# Patient Record
Sex: Female | Born: 1965 | Race: White | Hispanic: No | Marital: Married | Smoking: Never smoker
Health system: Southern US, Community
[De-identification: ages and names within clinical notes are randomized; demographics above are authoritative.]

## PROBLEM LIST (undated history)

## (undated) DIAGNOSIS — F988 Other specified behavioral and emotional disorders with onset usually occurring in childhood and adolescence: Secondary | ICD-10-CM

## (undated) DIAGNOSIS — Z87442 Personal history of urinary calculi: Secondary | ICD-10-CM

## (undated) DIAGNOSIS — J45909 Unspecified asthma, uncomplicated: Secondary | ICD-10-CM

## (undated) DIAGNOSIS — E039 Hypothyroidism, unspecified: Secondary | ICD-10-CM

## (undated) DIAGNOSIS — Z86018 Personal history of other benign neoplasm: Secondary | ICD-10-CM

## (undated) DIAGNOSIS — F329 Major depressive disorder, single episode, unspecified: Secondary | ICD-10-CM

## (undated) DIAGNOSIS — D649 Anemia, unspecified: Secondary | ICD-10-CM

## (undated) DIAGNOSIS — M25471 Effusion, right ankle: Secondary | ICD-10-CM

## (undated) DIAGNOSIS — Z9889 Other specified postprocedural states: Secondary | ICD-10-CM

## (undated) DIAGNOSIS — G4700 Insomnia, unspecified: Secondary | ICD-10-CM

## (undated) DIAGNOSIS — G43909 Migraine, unspecified, not intractable, without status migrainosus: Secondary | ICD-10-CM

## (undated) DIAGNOSIS — R112 Nausea with vomiting, unspecified: Secondary | ICD-10-CM

## (undated) DIAGNOSIS — G4730 Sleep apnea, unspecified: Secondary | ICD-10-CM

## (undated) DIAGNOSIS — I10 Essential (primary) hypertension: Secondary | ICD-10-CM

## (undated) DIAGNOSIS — J189 Pneumonia, unspecified organism: Secondary | ICD-10-CM

## (undated) DIAGNOSIS — K219 Gastro-esophageal reflux disease without esophagitis: Secondary | ICD-10-CM

## (undated) DIAGNOSIS — M25475 Effusion, left foot: Secondary | ICD-10-CM

## (undated) DIAGNOSIS — J302 Other seasonal allergic rhinitis: Secondary | ICD-10-CM

## (undated) DIAGNOSIS — M25474 Effusion, right foot: Secondary | ICD-10-CM

## (undated) DIAGNOSIS — F32A Depression, unspecified: Secondary | ICD-10-CM

## (undated) DIAGNOSIS — N3281 Overactive bladder: Secondary | ICD-10-CM

## (undated) HISTORY — PX: TUBAL LIGATION: SHX77

## (undated) HISTORY — PX: GASTRIC BYPASS: SHX52

## (undated) HISTORY — PX: AUGMENTATION MAMMAPLASTY: SUR837

## (undated) HISTORY — PX: ABDOMINOPLASTY: SUR9

## (undated) HISTORY — PX: CHOLECYSTECTOMY: SHX55

## (undated) HISTORY — PX: ABDOMINAL SURGERY: SHX537

## (undated) HISTORY — PX: URETHRAL SLING: SHX2621

## (undated) HISTORY — PX: ABDOMINAL HYSTERECTOMY: SHX81

## (undated) HISTORY — PX: REFRACTIVE SURGERY: SHX103

---
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
## 2017-07-24 DIAGNOSIS — Z8659 Personal history of other mental and behavioral disorders: Secondary | ICD-10-CM | POA: Insufficient documentation

## 2017-07-24 DIAGNOSIS — E7849 Other hyperlipidemia: Secondary | ICD-10-CM | POA: Insufficient documentation

## 2017-07-24 DIAGNOSIS — F5101 Primary insomnia: Secondary | ICD-10-CM | POA: Insufficient documentation

## 2017-07-24 DIAGNOSIS — E038 Other specified hypothyroidism: Secondary | ICD-10-CM | POA: Insufficient documentation

## 2018-02-18 DIAGNOSIS — F988 Other specified behavioral and emotional disorders with onset usually occurring in childhood and adolescence: Secondary | ICD-10-CM | POA: Insufficient documentation

## 2018-06-10 ENCOUNTER — Emergency Department: Payer: BLUE CROSS/BLUE SHIELD

## 2018-06-10 ENCOUNTER — Emergency Department
Admission: EM | Admit: 2018-06-10 | Discharge: 2018-06-10 | Disposition: A | Discharge status: Home / Self Care | Payer: BLUE CROSS/BLUE SHIELD | Attending: Emergency Medicine | Admitting: Emergency Medicine

## 2018-06-10 ENCOUNTER — Other Ambulatory Visit: Payer: Self-pay

## 2018-06-10 ENCOUNTER — Encounter: Payer: Self-pay | Admitting: Emergency Medicine

## 2018-06-10 DIAGNOSIS — R1010 Upper abdominal pain, unspecified: Secondary | ICD-10-CM | POA: Diagnosis present

## 2018-06-10 DIAGNOSIS — Z79899 Other long term (current) drug therapy: Secondary | ICD-10-CM | POA: Insufficient documentation

## 2018-06-10 DIAGNOSIS — Z9884 Bariatric surgery status: Secondary | ICD-10-CM | POA: Diagnosis not present

## 2018-06-10 DIAGNOSIS — K529 Noninfective gastroenteritis and colitis, unspecified: Secondary | ICD-10-CM | POA: Insufficient documentation

## 2018-06-10 LAB — COMPREHENSIVE METABOLIC PANEL
ALBUMIN: 4.2 g/dL (ref 3.5–5.0)
ALK PHOS: 81 U/L (ref 38–126)
ALT: 13 U/L (ref 0–44)
ANION GAP: 9 (ref 5–15)
AST: 16 U/L (ref 15–41)
BILIRUBIN TOTAL: 0.5 mg/dL (ref 0.3–1.2)
BUN: 13 mg/dL (ref 6–20)
CALCIUM: 8.9 mg/dL (ref 8.9–10.3)
CO2: 30 mmol/L (ref 22–32)
CREATININE: 0.75 mg/dL (ref 0.44–1.00)
Chloride: 100 mmol/L (ref 98–111)
GFR calc Af Amer: >60 mL/min (ref >60)
GFR calc non Af Amer: >60 mL/min (ref >60)
GLUCOSE: 94 mg/dL (ref 70–99)
Potassium: 3.3 mmol/L — ABNORMAL LOW (ref 3.5–5.1)
SODIUM: 139 mmol/L (ref 135–145)
TOTAL PROTEIN: 7.7 g/dL (ref 6.5–8.1)

## 2018-06-10 LAB — POCT PREGNANCY, URINE: PREG TEST UR: NEGATIVE

## 2018-06-10 LAB — CBC
HCT: 39 % (ref 36.0–46.0)
Hemoglobin: 13.1 g/dL (ref 12.0–15.0)
MCH: 30.5 pg (ref 26.0–34.0)
MCHC: 33.6 g/dL (ref 30.0–36.0)
MCV: 90.9 fL (ref 80.0–100.0)
PLATELETS: 265 10*3/uL (ref 150–400)
RBC: 4.29 MIL/uL (ref 3.87–5.11)
RDW: 13.2 % (ref 11.5–15.5)
WBC: 9.9 10*3/uL (ref 4.0–10.5)
nRBC: 0 % (ref 0.0–0.2)

## 2018-06-10 LAB — URINALYSIS, COMPLETE (UACMP) WITH MICROSCOPIC
Bilirubin Urine: NEGATIVE
GLUCOSE, UA: NEGATIVE mg/dL
Hgb urine dipstick: NEGATIVE
Ketones, ur: 5 mg/dL — AB
Nitrite: NEGATIVE
PH: 5 (ref 5.0–8.0)
PROTEIN: NEGATIVE mg/dL
Specific Gravity, Urine: 1.024 (ref 1.005–1.030)

## 2018-06-10 LAB — LIPASE, BLOOD: Lipase: 36 U/L (ref 11–51)

## 2018-06-10 MED ORDER — IOPAMIDOL (ISOVUE-300) INJECTION 61%
100.0000 mL | Freq: Once | INTRAVENOUS | Status: AC | PRN
  Administered 2018-06-10: 100 mL via INTRAVENOUS

## 2018-06-10 MED ORDER — ONDANSETRON HCL 4 MG/2ML IJ SOLN
4.0000 mg | Freq: Once | INTRAMUSCULAR | Status: AC
  Administered 2018-06-10: 4 mg via INTRAVENOUS
  Filled 2018-06-10: qty 2

## 2018-06-10 MED ORDER — ALUM & MAG HYDROXIDE-SIMETH 200-200-20 MG/5ML PO SUSP
30.0000 mL | Freq: Once | ORAL | Status: AC
  Administered 2018-06-10: 30 mL via ORAL
  Filled 2018-06-10: qty 30

## 2018-06-10 MED ORDER — FAMOTIDINE IN NACL 20-0.9 MG/50ML-% IV SOLN
20.0000 mg | Freq: Once | INTRAVENOUS | Status: AC
  Administered 2018-06-10: 20 mg via INTRAVENOUS
  Filled 2018-06-10: qty 50

## 2018-06-10 MED ORDER — FAMOTIDINE 20 MG PO TABS: 20.0000 mg | ORAL_TABLET | Freq: Two times a day (BID) | ORAL | 0 refills | Status: DC

## 2018-06-10 MED ORDER — SODIUM CHLORIDE 0.9 % IV BOLUS
1000.0000 mL | Freq: Once | INTRAVENOUS | Status: AC
  Administered 2018-06-10: 1000 mL via INTRAVENOUS

## 2018-06-10 MED ORDER — ALUMINUM-MAGNESIUM-SIMETHICONE 200-200-20 MG/5ML PO SUSP: 30.0000 mL | Freq: Three times a day (TID) | ORAL | 0 refills | Status: DC

## 2018-06-10 MED ORDER — ONDANSETRON 4 MG PO TBDP: 4.0000 mg | ORAL_TABLET | Freq: Three times a day (TID) | ORAL | 0 refills | Status: DC | PRN

## 2018-06-10 MED ORDER — OXYCODONE HCL 5 MG PO TABS
5.0000 mg | ORAL_TABLET | ORAL | Status: AC
  Administered 2018-06-10: 5 mg via ORAL
  Filled 2018-06-10: qty 1

## 2018-06-10 MED ORDER — OXYCODONE HCL 5 MG PO TABS: 5.0000 mg | ORAL_TABLET | Freq: Four times a day (QID) | ORAL | 0 refills | Status: DC | PRN

## 2018-06-10 MED ORDER — MORPHINE SULFATE (PF) 4 MG/ML IV SOLN
4.0000 mg | Freq: Once | INTRAVENOUS | Status: AC
  Administered 2018-06-10: 4 mg via INTRAVENOUS
  Filled 2018-06-10: qty 1

## 2018-06-10 NOTE — ED Provider Notes (Addendum)
[facility] Emergency Department Provider Note  ____________________________________________  Time seen: Approximately 11:18 AM  I have reviewed the triage vital signs and the nursing notes.   HISTORY  Chief Complaint Abdominal Pain    HPI Catherine Cervantes is a 52 y.o. female with a history of gastric bypass surgery, recently treated for sinusitis with decongestants and prednisone reports that for the past 2 nights she is been having upper abdominal pain and mid back pain.  Hurts to move, hurts to breathe.  No alleviating factors.  Denies chest pain or shortness of breath to me.  She also has a nonproductive cough.    No recent travel trauma hospitalization or surgery.  Normal oral intake although today she is having nausea and has not eaten.      History reviewed. No pertinent past medical history.   There are no active problems to display for this patient.    Past Surgical History:  Procedure Laterality Date  . ABDOMINAL SURGERY    . CHOLECYSTECTOMY    Bariatric surgery   Prior to Admission medications   Medication Sig Start Date End Date Taking? Authorizing Provider  aluminum-magnesium hydroxide-simethicone (MAALOX) 200-200-20 MG/5ML SUSP Take 30 mLs by mouth 4 (four) times daily -  before meals and at bedtime. 06/10/18   Sharman Cheek, MD  famotidine (PEPCID) 20 MG tablet Take 1 tablet (20 mg total) by mouth 2 (two) times daily. 06/10/18   Sharman Cheek, MD  ondansetron (ZOFRAN ODT) 4 MG disintegrating tablet Take 1 tablet (4 mg total) by mouth every 8 (eight) hours as needed for nausea or vomiting. 06/10/18   Sharman Cheek, MD  oxyCODONE (ROXICODONE) 5 MG immediate release tablet Take 1 tablet (5 mg total) by mouth every 6 (six) hours as needed for breakthrough pain. 06/10/18   Sharman Cheek, MD  Medications Reconcile with Patient's Chart  Medication Sig Dispensed Refills Start Date End Date Status  SUMAtriptan (IMITREX) 100 MG  tablet  TAKE 1 TABLET BY MOUTH ONCE AS NEEDED FOR MIGRAINE MAY TAKE A SECOND DOSE AFTER 2 HOURS IF NEEDED.  11 07/24/2017  Active  DM/pseudoephed/acetaminoph/cpm (THERAFLU COLD-COUGH ORAL)  Take by mouth as needed  0   Active  DM/PE/acetaminophen/doxylamine (VICKS DAYQUIL-NYQUIL ORAL)  Take by mouth once daily  0   Active  eucalyptus oil/menthol/camphor (VICKS VAPORUB TOP)  Apply topically as needed  0   Active  albuterol 90 mcg/actuation inhaler  Indications: Bronchitis Inhale 2 inhalations into the lungs every 6 (six) hours as needed for Wheezing 1 Inhaler  12 10/22/2017 10/22/2018 Active  RITALIN 10 mg tablet  Take 1 tablet (10 mg total) by mouth 2 (two) times daily 60 tablet  0 02/18/2018  Active  zolpidem (AMBIEN) 10 mg tablet  Take 1 tablet (10 mg total) by mouth nightly as needed for Sleep 30 tablet  3 02/18/2018  Active  estradiol (ESTRACE) 2 MG tablet  Take 1 tablet (2 mg total) by mouth once daily 30 tablet  2 03/18/2018  Active  levothyroxine (SYNTHROID, LEVOTHROID) 200 MCG tablet  Take 1 tablet (200 mcg total) by mouth once daily Take on an empty stomach with a glass of water at least 30-60 minutes before breakfast. 30 tablet  11 05/30/2018  Active  amoxicillin-clavulanate (AUGMENTIN) 875-125 mg tablet  Indications: Bacterial sinusitis, unspecified Take 1 tablet (875 mg total) by mouth 2 (two) times daily 20 tablet  0 06/05/2018 06/15/2018 Active  predniSONE (DELTASONE) 10 MG tablet  Indications: Bacterial sinusitis, unspecified 6-day taper; 6  pills day one, 5 pills day 2, 4 pills day 3, etc. Follow package instructions. 21 tablet  0 06/05/2018  Active  fluticasone propionate (FLONASE) 50 mcg/actuation nasal spray  Indications: Bacterial sinusitis, unspecified Place 2 sprays into both nostrils once daily           Allergies Patient has no known allergies.   No family history on file.  Social History Social History   Tobacco Use  . Smoking  status: Never Smoker  . Smokeless tobacco: Never Used  Substance Use Topics  . Alcohol use: Yes    Comment: occas  . Drug use: Not on file    Review of Systems  Constitutional:   No fever or chills.  ENT:   No sore throat. No rhinorrhea. Cardiovascular:   No chest pain or syncope. Respiratory:   No dyspnea positive nonproductive cough. Gastrointestinal:   Positive for upper abdominal pain.  No vomiting or diarrhea.  Musculoskeletal:   Negative for focal pain or swelling All other systems reviewed and are negative except as documented above in ROS and HPI.  ____________________________________________   PHYSICAL EXAM:  VITAL SIGNS: ED Triage Vitals  Enc Vitals Group     BP 06/10/18 0815 (!) 164/114     Pulse Rate 06/10/18 0815 66     Resp 06/10/18 0815 18     Temp 06/10/18 0816 97.7 F (36.5 C)     Temp Source 06/10/18 0815 Oral     SpO2 06/10/18 0815 100 %     Weight 06/10/18 0815 150 lb (68 kg)     Height 06/10/18 0815 5\' 1"  (1.549 m)     Head Circumference --      Peak Flow --      Pain Score 06/10/18 0815 7     Pain Loc --      Pain Edu? --      Excl. in GC? --     Vital signs reviewed, nursing assessments reviewed.   Constitutional:   Alert and oriented. Non-toxic appearance. Eyes:   Conjunctivae are normal. EOMI. PERRL. ENT      Head:   Normocephalic and atraumatic.      Nose:   No congestion/rhinnorhea.       Mouth/Throat:   MMM, no pharyngeal erythema. No peritonsillar mass.       Neck:   No meningismus. Full ROM. Hematological/Lymphatic/Immunilogical:   No cervical lymphadenopathy. Cardiovascular:   RRR. Symmetric bilateral radial and DP pulses.  No murmurs. Cap refill less than 2 seconds. Respiratory:   Normal respiratory effort without tachypnea/retractions. Breath sounds are clear and equal bilaterally. No wheezes/rales/rhonchi. Gastrointestinal:   Soft with left upper quadrant and epigastric tenderness. Non distended. There is no CVA tenderness.  No  rebound, rigidity, or guarding. Musculoskeletal:   Normal range of motion in all extremities. No joint effusions.  No lower extremity tenderness.  No edema. Neurologic:   Normal speech and language.  Motor grossly intact. No acute focal neurologic deficits are appreciated.  Skin:    Skin is warm, dry and intact. No rash noted.  No petechiae, purpura, or bullae.  ____________________________________________    LABS (pertinent positives/negatives) (all labs ordered are listed, but only abnormal results are displayed) Labs Reviewed  COMPREHENSIVE METABOLIC PANEL - Abnormal; Notable for the following components:      Result Value   Potassium 3.3 (*)    All other components within normal limits  URINALYSIS, COMPLETE (UACMP) WITH MICROSCOPIC - Abnormal; Notable for the following components:  Color, Urine YELLOW (*)    APPearance HAZY (*)    Ketones, ur 5 (*)    Leukocytes, UA TRACE (*)    Bacteria, UA RARE (*)    All other components within normal limits  LIPASE, BLOOD  CBC  POC URINE PREG, ED  POCT PREGNANCY, URINE   ____________________________________________   EKG    ____________________________________________    RADIOLOGY  Ct Abdomen Pelvis W Contrast  Result Date: 06/10/2018 CLINICAL DATA:  Upper abdominal pain and nausea EXAM: CT ABDOMEN AND PELVIS WITH CONTRAST TECHNIQUE: Multidetector CT imaging of the abdomen and pelvis was performed using the standard protocol following bolus administration of intravenous contrast. CONTRAST:  ISOVUE-300 IOPAMIDOL (ISOVUE-300) INJECTION 61% COMPARISON:  None. FINDINGS: Lower chest: Lung bases are clear. Hepatobiliary: Calcification is noted along the periphery of the right lobe of the liver, most likely secondary to granulomatous disease. Residua of old trauma is also possible in this area. No other focal liver lesions are appreciable. There is slight fatty infiltration in the region of the fissure for the ligamentum teres.  Gallbladder is absent. There is no evident biliary duct dilatation. Pancreas: There is no appreciable pancreatic mass or inflammatory focus. Spleen: No splenic lesions are evident. Adrenals/Urinary Tract: Adrenals bilaterally appear unremarkable. Kidneys bilaterally show no evident mass or hydronephrosis on either side. There is no appreciable renal or ureteral calculus on either side. Urinary bladder is midline with wall thickness within normal limits. Stomach/Bowel: Changes from previous gastric bypass procedure noted. No wall thickening or fluid in the postoperative regions. Elsewhere, there are several loops of jejunum in the left abdomen with mildly thickened walls. The surrounding mesentery is not thickened. There is no evident bowel obstruction. No free air or portal venous air. Vascular/Lymphatic: There is atherosclerotic calcification in portions of the aorta. No aneurysm. Major mesenteric arterial vessels appear patent. There is no evident adenopathy in the abdomen or pelvis. Reproductive: Uterus is absent.  There is no evident pelvic mass. Other: The appendix appears normal. There is no abscess or ascites in the abdomen or pelvis. Musculoskeletal: There is an apparent small bone island in the intertrochanteric region of the right proximal femur. There are no lytic or destructive bone lesions. There is no intramuscular or abdominal wall lesion. IMPRESSION: 1. Mild wall thickening involving several loops of proximal jejunum on the left. Suspect a degree of enteritis. No evident bowel obstruction. 2. Status post gastric bypass procedure without complicating features evident. 3.  Appendix appears normal.  No abscess in the abdomen or pelvis. 4.  No renal or ureteral calculus.  No hydronephrosis. 5. Calcification in the right lobe of the liver. Question residua of prior granulomas versus residual from previous trauma. No associated edema or mass effect. No abnormal enhancement in this area. 6.  Gallbladder  absent.  Uterus absent. 7.  Foci of aortic atherosclerosis. Aortic Atherosclerosis (ICD10-I70.0). Electronically Signed   By: Bretta Bang III M.D.   On: 06/10/2018 10:09    ____________________________________________   PROCEDURES Procedures  ____________________________________________  DIFFERENTIAL DIAGNOSIS   Bowel obstruction, perforation, gastritis, enteritis, pancreatitis.  Doubt biliary disease or appendicitis.  Doubt UTI or pyelonephritis.  Doubt STI PID TOA or torsion  CLINICAL IMPRESSION / ASSESSMENT AND PLAN / ED COURSE  Pertinent labs & imaging results that were available during my care of the patient were reviewed by me and considered in my medical decision making (see chart for details).    Patient presents with upper abdominal pain, worsening for the past 2  days.  With her recent prednisone use and history of bariatric surgery most likely this is gastritis.  Check labs, obtain CT scan due to complicated medical history and abdominal surgeries.  IV fluids, morphine 4 mg IV for pain control.   ----------------------------------------- 1:16 PM on 06/10/2018 -----------------------------------------  CT scan unremarkable, shows some mild jejunitis.  May be due to degree of GERD as well.  No severe complicating features.  Labs are normal.  Discussed with patient, she prefers to go home where she will continue an acids and follow-up with her doctor.  Advised her to stop the prednisone immediately that she is been taking for her sinusitis.  Opioid precautions discussed.     ____________________________________________   FINAL CLINICAL IMPRESSION(S) / ED DIAGNOSES    Final diagnoses:  Pain of upper abdomen  Jejunitis     ED Discharge Orders         Ordered    aluminum-magnesium hydroxide-simethicone (MAALOX) 200-200-20 MG/5ML SUSP  3 times daily before meals & bedtime     06/10/18 1315    famotidine (PEPCID) 20 MG tablet  2 times daily     06/10/18 1315      ondansetron (ZOFRAN ODT) 4 MG disintegrating tablet  Every 8 hours PRN     06/10/18 1315    oxyCODONE (ROXICODONE) 5 MG immediate release tablet  Every 6 hours PRN     06/10/18 1315          Portions of this note were generated with dragon dictation software. Dictation errors may occur despite best attempts at proofreading.    Sharman Cheek, MD 06/10/18 1316    Sharman Cheek, MD 06/10/18 [phone]

## 2018-06-10 NOTE — ED Notes (Signed)
Pt c/o having pain across mid back the last 2 nights, this morning began having LUQ pain with nausea. Denies any vomiting or diarrhea. Denies hx of kidney stones or having any painful urination.

## 2018-06-10 NOTE — ED Triage Notes (Signed)
Pt states mid to upper left quadrant pain since 0530 this am with nausea. Also lower back pain not more on one side than the other. Denies hx of kidney stones, denies burning/pain with urination. Does not have her gall bladder. Gastric bypass 4 years ago. Currently being treated for sinus infection. Appears in pain, grabbing her left side and leaning over in chair.

## 2018-06-10 NOTE — Discharge Instructions (Signed)
Your CT scan shows inflammation in your small intestine just outside the stomach.  Take nausea medicine and antacids, be sure to stay well-hydrated, and follow-up with your doctor later this week.  Your symptoms should improve day by day.

## 2019-02-02 ENCOUNTER — Other Ambulatory Visit: Payer: Self-pay | Admitting: Internal Medicine

## 2019-02-02 DIAGNOSIS — Z1231 Encounter for screening mammogram for malignant neoplasm of breast: Secondary | ICD-10-CM

## 2019-02-09 ENCOUNTER — Ambulatory Visit
Admission: RE | Admit: 2019-02-09 | Discharge: 2019-02-09 | Disposition: A | Discharge status: Home / Self Care | Payer: BLUE CROSS/BLUE SHIELD | Source: Ambulatory Visit | Attending: Internal Medicine | Admitting: Internal Medicine

## 2019-02-09 ENCOUNTER — Other Ambulatory Visit: Payer: Self-pay

## 2019-02-09 DIAGNOSIS — Z1231 Encounter for screening mammogram for malignant neoplasm of breast: Secondary | ICD-10-CM | POA: Diagnosis present

## 2019-09-21 DIAGNOSIS — R519 Headache, unspecified: Secondary | ICD-10-CM | POA: Insufficient documentation

## 2019-10-30 ENCOUNTER — Ambulatory Visit: Payer: BLUE CROSS/BLUE SHIELD | Attending: Internal Medicine

## 2019-10-30 DIAGNOSIS — Z23 Encounter for immunization: Secondary | ICD-10-CM

## 2019-10-30 NOTE — Progress Notes (Signed)
   Covid-19 Vaccination Clinic  Name:  Catherine Cervantes    MRN: [id number] DOB: 1965/12/23  10/30/2019  [name] was observed post Covid-19 immunization for 15 minutes without incident. She was provided with Vaccine Information Sheet and instruction to access the V-Safe system.   [name] was instructed to call 911 with any severe reactions post vaccine: Marland Kitchen Difficulty breathing  . Swelling of face and throat  . A fast heartbeat  . A bad rash all over body  . Dizziness and weakness   Immunizations Administered    Name Date Dose VIS Date Route   Moderna COVID-19 Vaccine 10/30/2019 12:52 PM 0.5 mL 07/21/2019 Intramuscular   Manufacturer: Moderna   Lot: [id number]   NDC: 85027-741-28

## 2019-11-02 DIAGNOSIS — G479 Sleep disorder, unspecified: Secondary | ICD-10-CM | POA: Insufficient documentation

## 2019-12-01 ENCOUNTER — Ambulatory Visit: Payer: Self-pay

## 2019-12-10 ENCOUNTER — Ambulatory Visit: Payer: BLUE CROSS/BLUE SHIELD | Attending: Family

## 2019-12-10 DIAGNOSIS — Z23 Encounter for immunization: Secondary | ICD-10-CM

## 2019-12-10 NOTE — Progress Notes (Signed)
   Covid-19 Vaccination Clinic  Name:  Catherine Cervantes    MRN: [id number] DOB: [birth year]  12/10/2019  [name] was observed post Covid-19 immunization for 15 minutes without incident. She was provided with Vaccine Information Sheet and instruction to access the V-Safe system.   [name] was instructed to call 911 with any severe reactions post vaccine: Marland Kitchen Difficulty breathing  . Swelling of face and throat  . A fast heartbeat  . A bad rash all over body  . Dizziness and weakness   Immunizations Administered    Name Date Dose VIS Date Route   Moderna COVID-19 Vaccine 12/10/2019  1:09 PM 0.5 mL 07/2019 Intramuscular   Manufacturer: Moderna   Lot: [id number]   NDC: 80998-338-25

## 2019-12-15 ENCOUNTER — Ambulatory Visit: Payer: Self-pay

## 2019-12-31 ENCOUNTER — Other Ambulatory Visit: Payer: Self-pay

## 2019-12-31 DIAGNOSIS — I10 Essential (primary) hypertension: Secondary | ICD-10-CM | POA: Diagnosis not present

## 2019-12-31 DIAGNOSIS — R319 Hematuria, unspecified: Secondary | ICD-10-CM | POA: Diagnosis present

## 2019-12-31 DIAGNOSIS — Z9049 Acquired absence of other specified parts of digestive tract: Secondary | ICD-10-CM | POA: Diagnosis not present

## 2019-12-31 DIAGNOSIS — Z9884 Bariatric surgery status: Secondary | ICD-10-CM | POA: Diagnosis not present

## 2019-12-31 DIAGNOSIS — R634 Abnormal weight loss: Secondary | ICD-10-CM | POA: Insufficient documentation

## 2019-12-31 DIAGNOSIS — N3001 Acute cystitis with hematuria: Secondary | ICD-10-CM | POA: Diagnosis not present

## 2019-12-31 DIAGNOSIS — Z79899 Other long term (current) drug therapy: Secondary | ICD-10-CM | POA: Diagnosis not present

## 2019-12-31 LAB — BASIC METABOLIC PANEL
Anion gap: 6 (ref 5–15)
BUN: 19 mg/dL (ref 6–20)
CO2: 26 mmol/L (ref 22–32)
Calcium: 8.8 mg/dL — ABNORMAL LOW (ref 8.9–10.3)
Chloride: 107 mmol/L (ref 98–111)
Creatinine, Ser: 0.96 mg/dL (ref 0.44–1.00)
GFR calc Af Amer: >60 mL/min (ref >60)
GFR calc non Af Amer: >60 mL/min (ref >60)
Glucose, Bld: 62 mg/dL — ABNORMAL LOW (ref 70–99)
Potassium: 4 mmol/L (ref 3.5–5.1)
Sodium: 139 mmol/L (ref 135–145)

## 2019-12-31 LAB — URINALYSIS, COMPLETE (UACMP) WITH MICROSCOPIC
Bilirubin Urine: NEGATIVE
Glucose, UA: NEGATIVE mg/dL
Ketones, ur: NEGATIVE mg/dL
Nitrite: NEGATIVE
Protein, ur: NEGATIVE mg/dL
Specific Gravity, Urine: 1.012 (ref 1.005–1.030)
WBC, UA: >50 WBC/hpf — ABNORMAL HIGH (ref 0–5)
pH: 6 (ref 5.0–8.0)

## 2019-12-31 LAB — CBC
HCT: 37 % (ref 36.0–46.0)
Hemoglobin: 12.7 g/dL (ref 12.0–15.0)
MCH: 30.9 pg (ref 26.0–34.0)
MCHC: 34.3 g/dL (ref 30.0–36.0)
MCV: 90 fL (ref 80.0–100.0)
Platelets: 207 10*3/uL (ref 150–400)
RBC: 4.11 MIL/uL (ref 3.87–5.11)
RDW: 12.4 % (ref 11.5–15.5)
WBC: 5 10*3/uL (ref 4.0–10.5)
nRBC: 0 % (ref 0.0–0.2)

## 2019-12-31 NOTE — ED Triage Notes (Signed)
Patient c/o left flank pain and hematuria beginning in December. Patient has been treated for UTI for same timeframe. Patient reports dramatic increase in hematuria today.

## 2020-01-01 ENCOUNTER — Emergency Department
Admission: EM | Admit: 2020-01-01 | Discharge: 2020-01-01 | Disposition: A | Discharge status: Home / Self Care | Payer: BLUE CROSS/BLUE SHIELD | Attending: Emergency Medicine | Admitting: Emergency Medicine

## 2020-01-01 ENCOUNTER — Emergency Department: Payer: BLUE CROSS/BLUE SHIELD

## 2020-01-01 DIAGNOSIS — R319 Hematuria, unspecified: Secondary | ICD-10-CM

## 2020-01-01 DIAGNOSIS — R634 Abnormal weight loss: Secondary | ICD-10-CM

## 2020-01-01 DIAGNOSIS — N3001 Acute cystitis with hematuria: Secondary | ICD-10-CM

## 2020-01-01 HISTORY — DX: Migraine, unspecified, not intractable, without status migrainosus: G43.909

## 2020-01-01 HISTORY — DX: Essential (primary) hypertension: I10

## 2020-01-01 LAB — GLUCOSE, CAPILLARY
Glucose-Capillary: 69 mg/dL — ABNORMAL LOW (ref 70–99)
Glucose-Capillary: 114 mg/dL — ABNORMAL HIGH (ref 70–99)

## 2020-01-01 MED ORDER — NITROFURANTOIN MONOHYD MACRO 100 MG PO CAPS
100.0000 mg | ORAL_CAPSULE | Freq: Two times a day (BID) | ORAL | 0 refills | Status: AC
Start: 2020-01-01 — End: 2020-01-06

## 2020-01-01 MED ORDER — NITROFURANTOIN MONOHYD MACRO 100 MG PO CAPS
100.0000 mg | ORAL_CAPSULE | Freq: Once | ORAL | Status: AC
  Administered 2020-01-01: 100 mg via ORAL
  Filled 2020-01-01: qty 1

## 2020-01-01 NOTE — ED Provider Notes (Signed)
[facility] Emergency Department Provider Note  ____________________________________________  Time seen: Approximately 3:02 AM  I have reviewed the triage vital signs and the nursing notes.   HISTORY  Chief Complaint Flank Pain and Hematuria   HPI Catherine Cervantes is a 54 y.o. female the history of hypertension, migraine, hysterectomy, gastric bypass several years ago who presents for evaluation of hematuria.  Patient has been dealing with on and off for hematuria and UTI since December 2020.  Patient has no prior history of chronic UTIs.  She has had several infections plus or minus hematuria with them.  She has been on Augmentin for the last 10 days for her most recent infection.  She reports that her symptoms are not improving.  Today she had increase hematuria.  She is passing small clots.  She reports that her urine is dark pink.  She also endorses an unintentional 30 pound loss since December.  No night sweats, no history of smoking, no family history of bladder cancer.  She is not on any blood thinners.  No fever or chills, no flank pain.  She is complaining of mild suprapubic pain which has been constant for the last few days.   Past Medical History:  Diagnosis Date  . Hypertension   . Migraine     There are no problems to display for this patient.   Past Surgical History:  Procedure Laterality Date  . ABDOMINAL HYSTERECTOMY    . ABDOMINAL SURGERY    . AUGMENTATION MAMMAPLASTY    . CHOLECYSTECTOMY    . GASTRIC BYPASS      Prior to Admission medications   Medication Sig Start Date End Date Taking? Authorizing Provider  aluminum-magnesium hydroxide-simethicone (MAALOX) 200-200-20 MG/5ML SUSP Take 30 mLs by mouth 4 (four) times daily -  before meals and at bedtime. 06/10/18   Sharman Cheek, MD  famotidine (PEPCID) 20 MG tablet Take 1 tablet (20 mg total) by mouth 2 (two) times daily. 06/10/18   Sharman Cheek, MD  nitrofurantoin,  macrocrystal-monohydrate, (MACROBID) 100 MG capsule Take 1 capsule (100 mg total) by mouth 2 (two) times daily for 5 days. 01/01/20 01/06/20  Nita Sickle, MD  ondansetron (ZOFRAN ODT) 4 MG disintegrating tablet Take 1 tablet (4 mg total) by mouth every 8 (eight) hours as needed for nausea or vomiting. 06/10/18   Sharman Cheek, MD  oxyCODONE (ROXICODONE) 5 MG immediate release tablet Take 1 tablet (5 mg total) by mouth every 6 (six) hours as needed for breakthrough pain. 06/10/18   Sharman Cheek, MD    Allergies Patient has no known allergies.  No family history on file.  Social History Social History   Tobacco Use  . Smoking status: Never Smoker  . Smokeless tobacco: Never Used  Substance Use Topics  . Alcohol use: Yes    Comment: occas  . Drug use: Not on file    Review of Systems  Constitutional: Negative for fever. + Unintentional weight loss Eyes: Negative for visual changes. ENT: Negative for sore throat. Neck: No neck pain  Cardiovascular: Negative for chest pain. Respiratory: Negative for shortness of breath. Gastrointestinal: Negative for abdominal pain, vomiting or diarrhea. Genitourinary: + dysuria and hematuria Musculoskeletal: Negative for back pain. Skin: Negative for rash. Neurological: Negative for headaches, weakness or numbness. Psych: No SI or HI  ____________________________________________   PHYSICAL EXAM:  VITAL SIGNS: ED Triage Vitals  Enc Vitals Group     BP 12/31/19 2013 (!) 171/95     Pulse Rate 12/31/19  2013 80     Resp 12/31/19 2013 18     Temp 12/31/19 2013 98.5 F (36.9 C)     Temp src --      SpO2 12/31/19 2013 100 %     Weight 12/31/19 2014 149 lb 14.6 oz (68 kg)     Height 12/31/19 2014 5\' 1"  (1.549 m)     Head Circumference --      Peak Flow --      Pain Score 12/31/19 2014 4     Pain Loc --      Pain Edu? --      Excl. in GC? --     Constitutional: Alert and oriented. Well appearing and in no apparent  distress. HEENT:      Head: Normocephalic and atraumatic.         Eyes: Conjunctivae are normal. Sclera is non-icteric.       Mouth/Throat: Mucous membranes are moist.       Neck: Supple with no signs of meningismus. Cardiovascular: Regular rate and rhythm. No murmurs, gallops, or rubs. Respiratory: Normal respiratory effort. Lungs are clear to auscultation bilaterally.  Gastrointestinal: Soft, non tender, and non distended with positive bowel sounds.  Genitourinary: No CVA tenderness. Musculoskeletal: No edema, cyanosis, or erythema of extremities. Neurologic: Normal speech and language. Face is symmetric. Moving all extremities. No gross focal neurologic deficits are appreciated. Skin: Skin is warm, dry and intact. No rash noted. Psychiatric: Mood and affect are normal. Speech and behavior are normal.  ____________________________________________   LABS (all labs ordered are listed, but only abnormal results are displayed)  Labs Reviewed  URINALYSIS, COMPLETE (UACMP) WITH MICROSCOPIC - Abnormal; Notable for the following components:      Result Value   Color, Urine YELLOW (*)    APPearance HAZY (*)    Hgb urine dipstick MODERATE (*)    Leukocytes,Ua LARGE (*)    WBC, UA >50 (*)    Bacteria, UA RARE (*)    All other components within normal limits  BASIC METABOLIC PANEL - Abnormal; Notable for the following components:   Glucose, Bld 62 (*)    Calcium 8.8 (*)    All other components within normal limits  GLUCOSE, CAPILLARY - Abnormal; Notable for the following components:   Glucose-Capillary 69 (*)    All other components within normal limits  GLUCOSE, CAPILLARY - Abnormal; Notable for the following components:   Glucose-Capillary 114 (*)    All other components within normal limits  URINE CULTURE  CBC   ____________________________________________  EKG  none  ____________________________________________  RADIOLOGY  I have personally reviewed the images performed  during this visit and I agree with the Radiologist's read.   Interpretation by Radiologist:  CT Renal Stone Study  Result Date: 01/01/2020 CLINICAL DATA:  Left flank pain and hematuria for 5 months, increased hematuria today EXAM: CT ABDOMEN AND PELVIS WITHOUT CONTRAST TECHNIQUE: Multidetector CT imaging of the abdomen and pelvis was performed following the standard protocol without IV contrast. COMPARISON:  06/10/2018 FINDINGS: Lower chest: No acute pleural or parenchymal lung disease. Hepatobiliary: No focal liver abnormality is seen. Status post cholecystectomy. No biliary dilatation. Pancreas: Unremarkable. No pancreatic ductal dilatation or surrounding inflammatory changes. Spleen: Normal in size without focal abnormality. Adrenals/Urinary Tract: No urinary tract calculi or obstructive uropathy. The adrenals are normal. The bladder is decompressed which limits its evaluation. Stomach/Bowel: Postsurgical changes from bariatric surgery. No bowel obstruction or ileus. No bowel wall thickening or inflammatory change. Normal appendix  right lower quadrant. Vascular/Lymphatic: Aortic atherosclerosis. No enlarged abdominal or pelvic lymph nodes. Reproductive: Status post hysterectomy. No adnexal masses. Other: No abdominal wall hernia or abnormality. No abdominopelvic ascites. Musculoskeletal: No acute or destructive bony lesions. Reconstructed images demonstrate no additional findings. IMPRESSION: 1. No acute intra-abdominal or intrapelvic process. No etiology identified to explain the patient's complaint of hematuria. 2.  Aortic Atherosclerosis (ICD10-I70.0). Electronically Signed   By: Sharlet Salina M.D.   On: 01/01/2020 00:14     ____________________________________________   PROCEDURES  Procedure(s) performed: None Procedures Critical Care performed:  None ____________________________________________   INITIAL IMPRESSION / ASSESSMENT AND PLAN / ED COURSE  54 y.o. female the history of  hypertension, migraine, hysterectomy, gastric bypass several years ago who presents for evaluation of hematuria.  Patient is well-appearing and in no distress with normal vital signs, hemodynamically stable, abdomen is soft with no tenderness with no palpable masses.  Review of old medical records shows that patient has had several infections in her urine since December 2020.  She has grown E. coli and Citrobacter on prior cultures which are resistant to several different antibiotics.  She was treated previously with Keflex which seemed to help.  Most recently, she is currently on Augmentin for the last 10 days and reports that her symptoms are not improving in an infected today her hematuria has gotten worse.  She is also complaining of an unintentional 30 pound weight loss.  She has no history of smoking and no family history of bladder cancer but I am concerned for possible malignancy.  We did a CT scan which I visualized personally showing no acute pathology.  The read was confirmed by radiology.  There is no signs of sepsis here.  Her urinalysis show hemoglobin, rare bacteria, large leuks.  It seems that both bacteria have been susceptible to nitrofurantoin therefore will switch to that at this time.  She does have mild hyperglycemia however she reports that she has not eaten for hours.  She has been 8 hours in the waiting room waiting to be seen.  She does not have a history of diabetes and does not take any medications for that.  Patient was given orange juice and peanut butter.  We will repeat her fingerstick.  Her labs show no leukocytosis, no acute blood loss anemia.  I have personally emailed Dr. Apolinar Junes from urology and Dr. Stasia Cavalier, patient's PCP, for close follow-up for a cystoscopy and urology evaluation.  I discussed with the patient importance to follow-up to rule out malignancy.  History is gathered from patient and her husband who was at bedside.       _____________________________________________ Please note:  Patient was evaluated in Emergency Department today for the symptoms described in the history of present illness. Patient was evaluated in the context of the global COVID-19 pandemic, which necessitated consideration that the patient might be at risk for infection with the SARS-CoV-2 virus that causes COVID-19. Institutional protocols and algorithms that pertain to the evaluation of patients at risk for COVID-19 are in a state of rapid change based on information released by regulatory bodies including the CDC and federal and state organizations. These policies and algorithms were followed during the patient's care in the ED.  Some ED evaluations and interventions may be delayed as a result of limited staffing during the pandemic.   [place] Controlled Substance Database was reviewed by me. ____________________________________________   FINAL CLINICAL IMPRESSION(S) / ED DIAGNOSES   Final diagnoses:  Hematuria, unspecified type  Acute  cystitis with hematuria  Unintentional weight loss      NEW MEDICATIONS STARTED DURING THIS VISIT:  ED Discharge Orders         Ordered    nitrofurantoin, macrocrystal-monohydrate, (MACROBID) 100 MG capsule  2 times daily     01/01/20 2440           Note:  This document was prepared using Dragon voice recognition software and may include unintentional dictation errors.    Alfred Levins, [place], MD 01/01/20 [phone]

## 2020-01-01 NOTE — Discharge Instructions (Addendum)
Please stop Augmentin and switch to nitrofurantoin.  As we talked about, make sure to follow-up with urology for further evaluation.  Your weight loss and these recurrent infections do make me concerned for possible malignancy.  Therefore it is imperative that you follow-up as soon as possible.  I have emailed Dr. Apolinar Junes and Dr. Letitia Libra about your presentation and my concerns.  Return to the emergency room for fever, chills, vomiting, worsening urinary symptoms, dizziness, worsening blood in your urine, if you pass out, chest pain or shortness of breath.

## 2020-01-01 NOTE — ED Notes (Signed)
Pt provided crackers and pb and OJ

## 2020-01-01 NOTE — ED Notes (Signed)
States that bloody urine has been ongoing for last several months, she lost 30lbs at beginning of year without attempting.

## 2020-01-01 NOTE — ED Notes (Signed)
Pt ambulatory to restroom on own power at this time. Pt continue to eat snacks when return to room

## 2020-01-02 LAB — URINE CULTURE
Culture: NO GROWTH
Special Requests: NORMAL

## 2020-01-20 ENCOUNTER — Encounter: Payer: Self-pay | Admitting: Urology

## 2020-01-20 ENCOUNTER — Other Ambulatory Visit: Payer: Self-pay

## 2020-01-20 ENCOUNTER — Ambulatory Visit (INDEPENDENT_AMBULATORY_CARE_PROVIDER_SITE_OTHER): Payer: BC Managed Care – PPO | Admitting: Urology

## 2020-01-20 VITALS — BP 130/88 | HR 59 | Ht 61.5 in | Wt 133.0 lb

## 2020-01-20 DIAGNOSIS — R310 Gross hematuria: Secondary | ICD-10-CM

## 2020-01-20 DIAGNOSIS — N390 Urinary tract infection, site not specified: Secondary | ICD-10-CM

## 2020-01-20 LAB — MICROSCOPIC EXAMINATION: WBC, UA: >30 /hpf — AB (ref 0–5)

## 2020-01-20 LAB — URINALYSIS, COMPLETE
Bilirubin, UA: NEGATIVE
Glucose, UA: NEGATIVE
Nitrite, UA: NEGATIVE
Specific Gravity, UA: 1.025 (ref 1.005–1.030)
Urobilinogen, Ur: 1 mg/dL (ref 0.2–1.0)
pH, UA: 6 (ref 5.0–7.5)

## 2020-01-20 LAB — BLADDER SCAN AMB NON-IMAGING

## 2020-01-20 MED ORDER — CEPHALEXIN 500 MG PO CAPS: 500.0000 mg | ORAL_CAPSULE | Freq: Four times a day (QID) | ORAL | 0 refills | Status: AC

## 2020-01-20 MED ORDER — AMOXICILLIN-POT CLAVULANATE 875-125 MG PO TABS
1.0000 | ORAL_TABLET | Freq: Two times a day (BID) | ORAL | 0 refills | Status: DC
Start: 2020-01-20 — End: 2020-01-20

## 2020-01-20 NOTE — Patient Instructions (Signed)
Cystoscopy Cystoscopy is a procedure that is used to help diagnose and sometimes treat conditions that affect the lower urinary tract. The lower urinary tract includes the bladder and the urethra. The urethra is the tube that drains urine from the bladder. Cystoscopy is done using a thin, tube-shaped instrument with a light and camera at the end (cystoscope). The cystoscope may be hard or flexible, depending on the goal of the procedure. The cystoscope is inserted through the urethra, into the bladder. Cystoscopy may be recommended if you have:  Urinary tract infections that keep coming back.  Blood in the urine (hematuria).  An inability to control when you urinate (urinary incontinence) or an overactive bladder.  Unusual cells found in a urine sample.  A blockage in the urethra, such as a urinary stone.  Painful urination.  An abnormality in the bladder found during an intravenous pyelogram (IVP) or CT scan. Cystoscopy may also be done to remove a sample of tissue to be examined under a microscope (biopsy). What are the risks? Generally, this is a safe procedure. However, problems may occur, including:  Infection.  Bleeding.  What happens during the procedure?  1. You will be given one or more of the following: ? A medicine to numb the area (local anesthetic). 2. The area around the opening of your urethra will be cleaned. 3. The cystoscope will be passed through your urethra into your bladder. 4. Germ-free (sterile) fluid will flow through the cystoscope to fill your bladder. The fluid will stretch your bladder so that your health care provider can clearly examine your bladder walls. 5. Your doctor will look at the urethra and bladder. 6. The cystoscope will be removed The procedure may vary among health care providers  What can I expect after the procedure? After the procedure, it is common to have: 1. Some soreness or pain in your abdomen and urethra. 2. Urinary symptoms.  These include: ? Mild pain or burning when you urinate. Pain should stop within a few minutes after you urinate. This may last for up to 1 week. ? A small amount of blood in your urine for several days. ? Feeling like you need to urinate but producing only a small amount of urine. Follow these instructions at home: General instructions  Return to your normal activities as told by your health care provider.   Do not drive for 24 hours if you were given a sedative during your procedure.  Watch for any blood in your urine. If the amount of blood in your urine increases, call your health care provider.  If a tissue sample was removed for testing (biopsy) during your procedure, it is up to you to get your test results. Ask your health care provider, or the department that is doing the test, when your results will be ready.  Drink enough fluid to keep your urine pale yellow.  Keep all follow-up visits as told by your health care provider. This is important. Contact a health care provider if you:  Have pain that gets worse or does not get better with medicine, especially pain when you urinate.  Have trouble urinating.  Have more blood in your urine. Get help right away if you:  Have blood clots in your urine.  Have abdominal pain.  Have a fever or chills.  Are unable to urinate. Summary  Cystoscopy is a procedure that is used to help diagnose and sometimes treat conditions that affect the lower urinary tract.  Cystoscopy is done using   a thin, tube-shaped instrument with a light and camera at the end.  After the procedure, it is common to have some soreness or pain in your abdomen and urethra.  Watch for any blood in your urine. If the amount of blood in your urine increases, call your health care provider.  If you were prescribed an antibiotic medicine, take it as told by your health care provider. Do not stop taking the antibiotic even if you start to feel better. This  information is not intended to replace advice given to you by your health care provider. Make sure you discuss any questions you have with your health care provider. Document Revised: 07/29/2018 Document Reviewed: 07/29/2018 Elsevier Patient Education  2020 [employer]vier Inc[employer]   

## 2020-01-20 NOTE — Progress Notes (Signed)
01/20/20 10:30 AM   Catherine Cervantes 05-Sep-1965 [id number]  CC: Recurrent UTIs, gross hematuria  HPI: I saw Catherine Cervantes in urology clinic today for evaluation of recurrent UTIs.  She is a 54 year old female with urologic history notable for 2 prior urethral slings 21 years ago and 8 years ago in [place].  It sounds like at least 1 of these was with mesh.  She reports her most recent sling there was a bladder perforation noted at the time of placement she had to keep the catheter for 48 hours.  She reports 30 pounds of weight loss over the last 6 months associated with numerous UTIs as well as some gross hematuria.  She has had hematuria even without infections.  Infections are documented in care everywhere and have been primarily E. coli, and there have been 6 documented infections since December 2020.  She also reports some left lower groin pain.  She feels like her symptoms will improve temporarily with antibiotics, but returned quickly.  She was most recently seen in the ER on 01/01/2020 and a CT stone protocol was performed.  This showed no hydronephrosis, however on my review of the films there is a 1 cm calcification in the bladder that is suspicious for calcification on a possible mesh extrusion from her prior sling.  She has a history of significant secondhand smoke exposure, but has never been smoker.  She denies any other carcinogenic exposures.  She also reports painful intercourse over the last few months.  She denies any incontinence.  She has had a hysterectomy previously.  Her past medical history is also notable for Hashimoto's and hypertension.  Urinalysis today concerning for infection with greater than 30 WBCs, 3-10 RBCs, moderate bacteria, no yeast, 2+ leukocytes, nitrite negative.  PMH: Past Medical History:  Diagnosis Date  . Hypertension   . Migraine     Surgical History: Past Surgical History:  Procedure Laterality Date  . ABDOMINAL HYSTERECTOMY    . ABDOMINAL SURGERY     . AUGMENTATION MAMMAPLASTY    . CHOLECYSTECTOMY    . GASTRIC BYPASS      Family History: No family history on file.  Social History:  reports that she has never smoked. She has never used smokeless tobacco. She reports current alcohol use. She reports that she does not use drugs.  Physical Exam: BP 130/88 (BP Location: Left Arm, Patient Position: Sitting, Cuff Size: Normal)   Pulse (!) 59   Ht 5' 1.5" (1.562 m)   Wt 133 lb (60.3 kg)   BMI 24.72 kg/m    Constitutional:  Alert and oriented, No acute distress. Cardiovascular: No clubbing, cyanosis, or edema. Respiratory: Normal respiratory effort, no increased work of breathing. GI: Abdomen is soft, nontender, nondistended, no abdominal masses GU: Deferred to time of cystoscopy  Laboratory Data: Reviewed, see HPI  Pertinent Imaging: I have personally reviewed the CT dated 01/01/2020.  No hydronephrosis, however there is a 1 cm stone in the lower left portion of the bladder that is worrisome for a stone on a mesh extrusion from her prior sling.  Assessment & Plan:   In summary, she is a 54 year old female with a history of 2 prior bladder slings and 6 culture documented urinary tract infections over the last 6 months as well as left groin pain, intermittent hematuria, and weight loss.  We discussed possible etiologies of hematuria at length ranging from urolithiasis to malignancy.  Based on her CT and her history, I have a high suspicion  for extrusion of her prior mesh urethral sling with a resulting bladder stone and recurrent infections.  Keflex for urine grossly concerning for UTI today, call with culture results Schedule cystoscopy Consider CT urogram if cystoscopy negative Likely refer to Dr. Sherron Monday pending cysto findings  I spent 60 total minutes on the day of the encounter including pre-visit review of the medical record, face-to-face time with the patient, and post visit ordering of labs/imaging/tests.  Legrand Rams, MD 01/20/2020  [facility] [address] [place], [place] [address] [phone]

## 2020-01-22 LAB — URINE CULTURE

## 2020-01-26 ENCOUNTER — Ambulatory Visit: Payer: Self-pay | Admitting: Urology

## 2020-01-27 ENCOUNTER — Encounter: Payer: Self-pay | Admitting: Urology

## 2020-01-27 ENCOUNTER — Ambulatory Visit (INDEPENDENT_AMBULATORY_CARE_PROVIDER_SITE_OTHER): Payer: BC Managed Care – PPO | Admitting: Urology

## 2020-01-27 ENCOUNTER — Other Ambulatory Visit: Payer: Self-pay

## 2020-01-27 VITALS — BP 150/88 | HR 71 | Ht 61.5 in | Wt 133.0 lb

## 2020-01-27 DIAGNOSIS — N390 Urinary tract infection, site not specified: Secondary | ICD-10-CM

## 2020-01-27 DIAGNOSIS — R310 Gross hematuria: Secondary | ICD-10-CM

## 2020-01-27 DIAGNOSIS — T83712A Erosion of implanted urethral mesh to surrounding organ or tissue, initial encounter: Secondary | ICD-10-CM | POA: Insufficient documentation

## 2020-01-27 MED ORDER — CEPHALEXIN 500 MG PO CAPS: 500.0000 mg | ORAL_CAPSULE | Freq: Every day | ORAL | 2 refills | Status: DC

## 2020-01-27 MED ORDER — LIDOCAINE HCL URETHRAL/MUCOSAL 2 % EX GEL
1.0000 "application " | Freq: Once | CUTANEOUS | Status: AC
  Administered 2020-01-27: 1 via URETHRAL

## 2020-01-27 NOTE — Addendum Note (Signed)
Addended by: Sondra Come on: 01/27/2020 04:59 PM   Modules accepted: Orders

## 2020-01-27 NOTE — Progress Notes (Signed)
Cystoscopy Procedure Note:  Indication: Recurrent UTIs, gross hematuria, history of urethral sling  After informed consent and discussion of the procedure and its risks, Millissa Hasten was positioned and prepped in the standard fashion. Cystoscopy was performed with a flexible cystoscope. The urethra, bladder neck and entire bladder was visualized in a standard fashion.  The bladder was grossly normal.  On retroflexion I could identify a stone at the bladder neck.  Careful pullback urethroscopy demonstrated erosion of the mesh at the left proximal urethra with a large 1 cm stone at the area of mesh erosion.  Photos taken and uploaded to epic in the media tab.   Findings: Erosion of urethral mesh in the left proximal urethra with 1cm calcification  Assessment and Plan: Will coordinate follow-up with Dr. Sherron Monday to discuss options for excision and repair Keflex prophylaxis until definitive repair  Legrand Rams, MD 01/27/2020

## 2020-01-28 LAB — URINALYSIS, COMPLETE
Bilirubin, UA: NEGATIVE
Glucose, UA: NEGATIVE
Nitrite, UA: NEGATIVE
Protein,UA: NEGATIVE
Specific Gravity, UA: >1.03 — ABNORMAL HIGH (ref 1.005–1.030)
Urobilinogen, Ur: 0.2 mg/dL (ref 0.2–1.0)
pH, UA: 5.5 (ref 5.0–7.5)

## 2020-01-28 LAB — MICROSCOPIC EXAMINATION

## 2020-02-08 ENCOUNTER — Ambulatory Visit (INDEPENDENT_AMBULATORY_CARE_PROVIDER_SITE_OTHER): Payer: BC Managed Care – PPO | Admitting: Urology

## 2020-02-08 ENCOUNTER — Other Ambulatory Visit: Payer: Self-pay

## 2020-02-08 ENCOUNTER — Encounter: Payer: Self-pay | Admitting: Urology

## 2020-02-08 VITALS — BP 121/85 | HR 66 | Ht 61.0 in | Wt 135.0 lb

## 2020-02-08 DIAGNOSIS — N3020 Other chronic cystitis without hematuria: Secondary | ICD-10-CM

## 2020-02-08 MED ORDER — CEPHALEXIN 250 MG PO CAPS: 250.0000 mg | ORAL_CAPSULE | Freq: Every day | ORAL | 11 refills | Status: DC

## 2020-02-08 NOTE — Addendum Note (Signed)
Addended by: Veneta Penton on: 02/08/2020 11:26 AM   Modules accepted: Orders

## 2020-02-08 NOTE — Progress Notes (Addendum)
02/08/2020 10:14 AM   Catherine Cervantes March 02, 1966 [id number]  Referring provider: Gracelyn Nurse, MD [address] [place],  [place] [address]  Chief Complaint  Patient presents with  . Follow-up    HPI: [place]: I saw Catherine Cervantes in urology clinic today for evaluation of recurrent UTIs.  She is a 54 year old female with urologic history notable for 2 prior urethral slings 21 years ago and 8 years ago in [place].  It sounds like at least 1 of these was with mesh.  She reports her most recent sling there was a bladder perforation noted at the time of placement she had to keep the catheter for 48 hours.  She reports 30 pounds of weight loss over the last 6 months associated with numerous UTIs as well as some gross hematuria.  She has had hematuria even without infections.  Infections are documented in care everywhere and have been primarily E. coli, and there have been 6 documented infections since December 2020.  She also reports some left lower groin pain.  She feels like her symptoms will improve temporarily with antibiotics, but returned quickly.  She was most recently seen in the ER on 01/01/2020 and a CT stone protocol was performed.  This showed no hydronephrosis, however on my review of the films there is a 1 cm calcification in the bladder that is suspicious for calcification on a possible mesh extrusion from her prior sling.  She has a history of significant secondhand smoke exposure, but has never been smoker.  She denies any other carcinogenic exposures.  She also reports painful intercourse over the last few months.  She denies any incontinence.  She has had a hysterectomy previously.  Her past medical history is also notable for Hashimoto's and hypertension.  Cysto: On retroflexion I could identify a stone at the bladder neck.  Careful pullback urethroscopy demonstrated erosion of the mesh at the left proximal urethra with a large 1 cm stone at the area of mesh erosion.  Photos  taken and uploaded to epic in the media tab.  Patient was put on daily Keflex  Today Patient had a sling approximately 21 years ago.  She had a second sling 8 years ago.  Prior to the first sling she would leak a lot with walking and looking after her children.  8 years ago she was leaking with coughing and sneezing and things were getting worse but she thinks she was not wearing pads.  They apparently punctured her bladder but healed well  On further questioning patient says she does not leak with walking but she can leak a lot during intercourse.  She thinks it is more with thrusting but it really can occur at any time and can be high-volume  She noted orange color urine in November last year and now has documented recurrent bladder infections.  She would get burning incomplete emptying and other symptoms that would temporarily respond to antibiotics.  She would get blood in the urine with or without infection.  She voids every 60 to 90 minutes.  She feels like she needs to go more often.  She gets up 2-3 times a night getting a little bit better recently.  She only leaks with a very hard sneeze and does not wear a pad.  No trauma to female penis during intercourse.  No blood with intercourse  She went to the hospital but a month ago with left lower quadrant pain.  The pain comes and goes and is  not as bad now.  I did not see any x-ray performed on that day but urine culture in June was normal  Clinically not infected today  No neurologic issues.  She is had a hysterectomy  On pelvic examination patient's anterior vaginal wall and bladder neck is very well supported.  I think I could feel some mesh extrusion and see it at the right urethrovesical angle just to the right of the midline.  If it is the mesh it is in the area of the proximal third of urethra.  No stress incontinence.   PMH: Past Medical History:  Diagnosis Date  . Hypertension   . Migraine     Surgical History: Past Surgical  History:  Procedure Laterality Date  . ABDOMINAL HYSTERECTOMY    . ABDOMINAL SURGERY    . AUGMENTATION MAMMAPLASTY    . CHOLECYSTECTOMY    . GASTRIC BYPASS      Home Medications:  Allergies as of 02/08/2020   No Known Allergies     Medication List       Accurate as of February 08, 2020 10:14 AM. If you have any questions, ask your nurse or doctor.        STOP taking these medications   amoxicillin-clavulanate 875-125 MG tablet Commonly known as: AUGMENTIN Stopped by: Reece Packer, MD   traZODone 50 MG tablet Commonly known as: DESYREL Stopped by: Reece Packer, MD     TAKE these medications   cephALEXin 500 MG capsule Commonly known as: Keflex Take 1 capsule (500 mg total) by mouth daily.   citalopram 20 MG tablet Commonly known as: CELEXA Take by mouth.   fluticasone 50 MCG/ACT nasal spray Commonly known as: FLONASE   hydrochlorothiazide 12.5 MG tablet Commonly known as: HYDRODIURIL Take 12.5 mg by mouth daily.   levothyroxine 200 MCG tablet Commonly known as: SYNTHROID TAKE 1 TAB BY MOUTH ON EMPTY STOMACH WITH A GLASS OF WATER AT LEAST 30 60 MINUTES BEFORE BREAKFAST.   omeprazole 20 MG capsule Commonly known as: PRILOSEC Take 20 mg by mouth 2 (two) times daily.   SUMAtriptan 100 MG tablet Commonly known as: IMITREX Take 100 mg by mouth 2 (two) times daily as needed.   verapamil 240 MG CR tablet Commonly known as: CALAN-SR Take 240 mg by mouth at bedtime.   zolpidem 12.5 MG CR tablet Commonly known as: AMBIEN CR Take 12.5 mg by mouth at bedtime as needed.       Allergies: No Known Allergies  Family History: No family history on file.  Social History:  reports that she has never smoked. She has never used smokeless tobacco. She reports current alcohol use. She reports that she does not use drugs.  ROS:                                        Physical Exam: There were no vitals taken for this visit.    Laboratory Data: Lab Results  Component Value Date   WBC 5.0 12/31/2019   HGB 12.7 12/31/2019   HCT 37.0 12/31/2019   MCV 90.0 12/31/2019   PLT 207 12/31/2019    Lab Results  Component Value Date   CREATININE 0.96 12/31/2019    No results found for: PSA  No results found for: TESTOSTERONE  No results found for: HGBA1C  Urinalysis    Component Value Date/Time   COLORURINE YELLOW (A)  12/31/2019 2017   APPEARANCEUR Hazy (A) 01/27/2020 1430   LABSPEC 1.012 12/31/2019 2017   PHURINE 6.0 12/31/2019 2017   GLUCOSEU Negative 01/27/2020 1430   HGBUR MODERATE (A) 12/31/2019 2017   BILIRUBINUR Negative 01/27/2020 1430   KETONESUR NEGATIVE 12/31/2019 2017   PROTEINUR Negative 01/27/2020 1430   PROTEINUR NEGATIVE 12/31/2019 2017   NITRITE Negative 01/27/2020 1430   NITRITE NEGATIVE 12/31/2019 2017   LEUKOCYTESUR Trace (A) 01/27/2020 1430   LEUKOCYTESUR LARGE (A) 12/31/2019 2017    Pertinent Imaging:   Assessment & Plan: Picture was drawn.  Patient understands there is mesh likely in the urethra with a stone but there may also be mesh extrusion in the vagina.  Patient has had a mesh sling and possibly to mesh slings.  I reviewed photos that were difficult to see the detail of as well as the CT scan.  It appears she has mesh in the proximal urethra with a 1 cm stone.  I thought it was best to get urodynamics to have as much information as possible.  We will do this in [place] and I will see her the same day and perform cystoscopy.  I will look around the bladder very carefully as well.  I prefer the optics of my telescope better.  I did not detail surgical options today but certainly surgery could result in worsening urinary incontinence which was moderately severe 20 years ago.  Her bladder support will make the surgery a little bit more challenging and she is a petite lady.  I will keep on daily Keflex.  Patient is most concerned with the left lower quadrant pain which may or  may not have been related but is greatly improved.  Hopefully she will stay relatively asymptomatic and feels like she is emptying better on the daily Keflex  In [place] I will try to send for her medical records from [place]  There are no diagnoses linked to this encounter.  No follow-ups on file.  Martina Sinner, MD  [facility] [address], [address] [place], [place] [address] [phone]

## 2020-02-09 LAB — URINALYSIS, COMPLETE
Bilirubin, UA: NEGATIVE
Glucose, UA: NEGATIVE
Ketones, UA: NEGATIVE
Nitrite, UA: NEGATIVE
Protein,UA: NEGATIVE
RBC, UA: NEGATIVE
Specific Gravity, UA: 1.025 (ref 1.005–1.030)
Urobilinogen, Ur: 0.2 mg/dL (ref 0.2–1.0)
pH, UA: 5.5 (ref 5.0–7.5)

## 2020-02-09 LAB — MICROSCOPIC EXAMINATION: Bacteria, UA: NONE SEEN

## 2020-02-10 LAB — COLOGUARD: COLOGUARD: NEGATIVE

## 2020-02-11 LAB — CULTURE, URINE COMPREHENSIVE

## 2020-02-12 ENCOUNTER — Telehealth: Payer: Self-pay

## 2020-02-12 MED ORDER — NITROFURANTOIN MONOHYD MACRO 100 MG PO CAPS
100.0000 mg | ORAL_CAPSULE | Freq: Two times a day (BID) | ORAL | 0 refills | Status: AC
Start: 2020-02-12 — End: ?

## 2020-02-12 NOTE — Telephone Encounter (Signed)
-----   Message from Alfredo Martinez, MD sent at 02/12/2020 12:07 PM EDT -----  Macrodantin 100 mg twice a day for 1 week; then go back on daily Keflex   ----- Message ----- From: Sarita Bottom, CMA Sent: 02/11/2020   4:50 PM EDT To: Alfredo Martinez, MD   ----- Message ----- From: Interface, [facility] Lab Results In Sent: 02/09/2020   9:37 AM EDT To: [facility]

## 2020-02-12 NOTE — Telephone Encounter (Signed)
-----   Message from Scott MacDiarmid, MD sent at 02/12/2020 12:07 PM EDT ----- ° Macrodantin 100 mg twice a day for 1 week; then go back on daily Keflex ° ° °----- Message ----- °From: Morris, Rebeca A, CMA °Sent: 02/11/2020   4:50 PM EDT °To: Scott MacDiarmid, MD ° ° °----- Message ----- °From: Interface, [facility] Lab Results In °Sent: 02/09/2020   9:37 AM EDT °To: [facility] ° ° ° °

## 2020-02-12 NOTE — Telephone Encounter (Signed)
Pt aware of results and instructions. Sent medication to pharmacy

## 2020-03-08 ENCOUNTER — Other Ambulatory Visit: Payer: Self-pay | Admitting: Urology

## 2020-04-04 ENCOUNTER — Encounter (HOSPITAL_COMMUNITY): Payer: Self-pay

## 2020-04-04 NOTE — Progress Notes (Addendum)
COVID Vaccine Completed: Yes Date COVID Vaccine completed: 10/30/19, 12/10/19 COVID vaccine manufacturer:  Moderna      PCP - Dr. Marcelino Duster Cardiologist -N/A  Chest x-ray - 08/31/19 care everywhere EKG - 04/05/20 in epic Stress Test - greater than 2 years ECHO - greater than 2 years Cardiac Cath - N/A  Sleep Study - Yes  CPAP - History of OSA prior to gastric bypass no machine use at this time  Fasting Blood Sugar - N/A Checks Blood Sugar _N/A____ times a day  Blood Thinner Instructions: N/A Aspirin Instructions: N/A Last Dose: N/A  Anesthesia review:  N/A  Patient denies shortness of breath, fever, cough and chest pain at PAT appointment   Patient verbalized understanding of instructions that were given to them at the PAT appointment. Patient was also instructed that they will need to review over the PAT instructions again at home before surgery.

## 2020-04-04 NOTE — Patient Instructions (Addendum)
DUE TO COVID-19 ONLY ONE VISITOR IS ALLOWED TO COME WITH YOU AND STAY IN THE WAITING ROOM ONLY DURING PRE OP AND PROCEDURE.   IF YOU WILL BE ADMITTED INTO THE HOSPITAL YOU ARE ALLOWED ONE SUPPORT PERSON DURING VISITATION HOURS ONLY (10AM -8PM)   . The support person may change daily. . The support person must pass our screening, gel in and out, and wear a mask at all times, including in the patient's room. . Patients must also wear a mask when staff or their support person are in the room.   COVID SWAB TESTING MUST BE COMPLETED ON:  Friday, Aug. 20, 2021 at 10 AM at [facility] [address] [place]  (Must self quarantine after testing. Follow instructions on handout.)       Your procedure is scheduled on: Tuesday, Aug. 24, 2021   Report to [facility] Main  Entrance   Report to Short Stay at 5:30 AM   [facility])   Call this number if you have problems the morning of surgery [phone]   Do not eat food :After Midnight.   May have liquids until 4:30 AM   day of surgery  CLEAR LIQUID DIET  Foods Allowed                                                                     Foods Excluded  Water, Black Coffee and tea, regular and decaf                             liquids that you cannot  Plain Jell-O in any flavor  (No red)                                           see through such as: Fruit ices (not with fruit pulp)                                     milk, soups, orange juice              Iced Popsicles (No red)                                    All solid food                                   Apple juices Sports drinks like Gatorade (No red) Lightly seasoned clear broth or consume(fat free) Sugar, honey syrup  Sample Menu Breakfast                                Lunch  Supper Cranberry juice                    Beef broth                            Chicken broth Jell-O                                      Grape juice                           Apple juice Coffee or tea                        Jell-O                                      Popsicle                                                Coffee or tea                        Coffee or tea      Oral Hygiene is also important to reduce your risk of infection.                                    Remember - BRUSH YOUR TEETH THE MORNING OF SURGERY WITH YOUR REGULAR TOOTHPASTE   Do NOT smoke after Midnight   Take these medicines the morning of surgery with A SIP OF WATER: Allegra, Synthroid, Omeprazole,    May use Flonase day of surgery if needed                               You may not have any metal on your body including hair pins, jewelry, and body piercings             Do not wear make-up, lotions, powders, perfumes/cologne, or deodorant             Do not wear nail polish.  Do not shave  48 hours prior to surgery.                Do not bring valuables to the hospital. [facility] IS NOT             RESPONSIBLE   FOR VALUABLES.   Contacts, dentures or bridgework may not be worn into surgery.   Bring small overnight bag day of surgery.    Special Instructions: Bring a copy of your healthcare power of attorney and living will documents         the day of surgery if you haven't scanned them in before.              Please read over the following fact sheets you were given: IF YOU HAVE QUESTIONS ABOUT YOUR PRE OP INSTRUCTIONS PLEASE CALL [phone]   [facility] - Preparing for Surgery Before  surgery, you can play an important role.  Because skin is not sterile, your skin needs to be as free of germs as possible.  You can reduce the number of germs on your skin by washing with CHG (chlorahexidine gluconate) soap before surgery.  CHG is an antiseptic cleaner which kills germs and bonds with the skin to continue killing germs even after washing. Please DO NOT use if you have an allergy to CHG or antibacterial soaps.  If your skin becomes  reddened/irritated stop using the CHG and inform your nurse when you arrive at Short Stay. Do not shave (including legs and underarms) for at least 48 hours prior to the first CHG shower.  You may shave your face/neck.  Please follow these instructions carefully:  1.  Shower with CHG Soap the night before surgery and the  morning of surgery.  2.  If you choose to wash your hair, wash your hair first as usual with your normal  shampoo.  3.  After you shampoo, rinse your hair and body thoroughly to remove the shampoo.                             4.  Use CHG as you would any other liquid soap.  You can apply chg directly to the skin and wash.  Gently with a scrungie or clean washcloth.  5.  Apply the CHG Soap to your body ONLY FROM THE NECK DOWN.   Do   not use on face/ open                           Wound or open sores. Avoid contact with eyes, ears mouth and   genitals (private parts).                       Wash face,  Genitals (private parts) with your normal soap.             6.  Wash thoroughly, paying special attention to the area where your    surgery  will be performed.  7.  Thoroughly rinse your body with warm water from the neck down.  8.  DO NOT shower/wash with your normal soap after using and rinsing off the CHG Soap.                9.  Pat yourself dry with a clean towel.            10.  Wear clean pajamas.            11.  Place clean sheets on your bed the night of your first shower and do not  sleep with pets. Day of Surgery : Do not apply any lotions/deodorants the morning of surgery.  Please wear clean clothes to the hospital/surgery center.  FAILURE TO FOLLOW THESE INSTRUCTIONS MAY RESULT IN THE CANCELLATION OF YOUR SURGERY  PATIENT SIGNATURE_________________________________  NURSE SIGNATURE__________________________________  ________________________________________________________________________

## 2020-04-05 ENCOUNTER — Encounter (HOSPITAL_COMMUNITY)
Admission: RE | Admit: 2020-04-05 | Discharge: 2020-04-05 | Disposition: A | Discharge status: Home / Self Care | Payer: BLUE CROSS/BLUE SHIELD | Source: Ambulatory Visit | Attending: Urology | Admitting: Urology

## 2020-04-05 ENCOUNTER — Encounter (HOSPITAL_COMMUNITY): Payer: Self-pay

## 2020-04-05 ENCOUNTER — Other Ambulatory Visit: Payer: Self-pay

## 2020-04-05 DIAGNOSIS — R9431 Abnormal electrocardiogram [ECG] [EKG]: Secondary | ICD-10-CM | POA: Diagnosis not present

## 2020-04-05 DIAGNOSIS — Z01810 Encounter for preprocedural cardiovascular examination: Secondary | ICD-10-CM | POA: Insufficient documentation

## 2020-04-05 DIAGNOSIS — Z01812 Encounter for preprocedural laboratory examination: Secondary | ICD-10-CM | POA: Insufficient documentation

## 2020-04-05 HISTORY — DX: Personal history of other benign neoplasm: Z86.018

## 2020-04-05 HISTORY — DX: Other specified behavioral and emotional disorders with onset usually occurring in childhood and adolescence: F98.8

## 2020-04-05 HISTORY — DX: Hypothyroidism, unspecified: E03.9

## 2020-04-05 HISTORY — DX: Effusion, right foot: M25.474

## 2020-04-05 HISTORY — DX: Pneumonia, unspecified organism: J18.9

## 2020-04-05 HISTORY — DX: Overactive bladder: N32.81

## 2020-04-05 HISTORY — DX: Personal history of urinary calculi: Z87.442

## 2020-04-05 HISTORY — DX: Insomnia, unspecified: G47.00

## 2020-04-05 HISTORY — DX: Gastro-esophageal reflux disease without esophagitis: K21.9

## 2020-04-05 HISTORY — DX: Depression, unspecified: F32.A

## 2020-04-05 HISTORY — DX: Unspecified asthma, uncomplicated: J45.909

## 2020-04-05 HISTORY — DX: Other seasonal allergic rhinitis: J30.2

## 2020-04-05 HISTORY — DX: Effusion, left foot: M25.475

## 2020-04-05 HISTORY — DX: Nausea with vomiting, unspecified: R11.2

## 2020-04-05 HISTORY — DX: Sleep apnea, unspecified: G47.30

## 2020-04-05 HISTORY — DX: Other specified postprocedural states: Z98.890

## 2020-04-05 HISTORY — DX: Anemia, unspecified: D64.9

## 2020-04-05 HISTORY — DX: Effusion, right ankle: M25.471

## 2020-04-05 LAB — CBC
HCT: 39.5 % (ref 36.0–46.0)
Hemoglobin: 13.2 g/dL (ref 12.0–15.0)
MCH: 31.1 pg (ref 26.0–34.0)
MCHC: 33.4 g/dL (ref 30.0–36.0)
MCV: 92.9 fL (ref 80.0–100.0)
Platelets: 223 10*3/uL (ref 150–400)
RBC: 4.25 MIL/uL (ref 3.87–5.11)
RDW: 12.5 % (ref 11.5–15.5)
WBC: 4.9 10*3/uL (ref 4.0–10.5)
nRBC: 0 % (ref 0.0–0.2)

## 2020-04-05 LAB — BASIC METABOLIC PANEL
Anion gap: 8 (ref 5–15)
BUN: 17 mg/dL (ref 6–20)
CO2: 28 mmol/L (ref 22–32)
Calcium: 9 mg/dL (ref 8.9–10.3)
Chloride: 102 mmol/L (ref 98–111)
Creatinine, Ser: 0.8 mg/dL (ref 0.44–1.00)
GFR calc Af Amer: >60 mL/min (ref >60)
GFR calc non Af Amer: >60 mL/min (ref >60)
Glucose, Bld: 95 mg/dL (ref 70–99)
Potassium: 4.5 mmol/L (ref 3.5–5.1)
Sodium: 138 mmol/L (ref 135–145)

## 2020-04-07 NOTE — H&P (Signed)
[facility]. It appears she has mesh sling in the proximal 3rd of the urethra and or bladder neck with the stone. She gets blood in the urine and also gets bladder infections. Twenty years ago her stress incontinence was moderately severe even with walking. It was much less severe before the 2nd procedure 8 years ago. She currently leaks with a heart sneeze with does not wear a pad but leaks a lot possibly with thrusting during intercourse. I thought I could see a little bit of mesh at the right urethrovesical angle. She is a petite lady with good support making access more difficult. She has trouble by left lower quadrant discomfort which may or may not be related   On urodynamics the patient voided 56 mL with maximum flow of 7 mL/second. Residual was 30 mL. Maximum bladder capacity 325 mL. Bladder is unstable reaching pressure 6 cm of water. She had no stress incontinence generating a Valsalva pressure of 86 cm of water. During voiding she voided 296 mL with a maximum flow of 14 mL/second. Maximum voiding pressure was 23 cm water. She had interrupted pattern. It was a prolonged pattern. Bladder neck descended 1 or 2 cm. I did not see any filling defect at the bladder neck. The details of the urodynamics or sign-in dictated   Patient underwent flexible cystoscopy. The bladder itself looked normal. I saw no foreign body or cystitis. In the urethra I could see mesh or least Prolene suture on the mesh at 3 o'clock (one to 5) but really along the left border of the urethra with approximately a 1 cm stone on it. It was just at the interface of the bladder and the urethra. The rest the urethra looked normal.   Again she is quite petite and on vaginal exam the speculum would actually be hitting a ridge mid posterior vaginal wall which in retrospect may have been constipation which she has and she has not had a rectocele repair or mesh posteriorly. I had the lift speculum over  the area and depress downward which was a little bit uncomfortable for her. She looked like she had a yeast infection. I could not feel or see any mesh this time but again the bladder neck is significantly well-supported. I was trying with a Q-tip as well   Picture was drawn. I will have her go back on daily prophylaxis and switch it since I think she had a breakthrough on the Keflex.  I will call in Diflucan 100 mg daily for 3 days And put her on daily Macrodantin and send the urine for culture   The position of the mesh and stone is a difficult 1. It may not be visible trans vesicle since is in the urethra. The urethra would need to be open quite aggressively to get exposure transurethrally. Vaginal access will make this more challenging and she would need to be consented that it may not be possible. A trans urethral laser procedure is reasonable but it could make the area larger.   She understands that of the laser procedure fails it can complicate the issue much greater. She understands the vaginal access problem and I will going to give her a lower Fleet enema the night before surgery. We talked about injury to other structure with fistula to vagina from bladder or urethra or both. We talked about rare bowel in ureteral injury and sequelae. She understands her incontinence can worsen in approximately 50% and could  be quite severe. She understands that the intercourse leaking is likely from overactivity and that we would be normally treating this with medicine. She understands that may not be able to do the surgery vaginally but I believe I can. I do not think an abdominal approach will see the mesh appropriately. She understands erosion is usually more ventrally but hers is along the side.   I will also be checking for mesh extrusion intraoperatively   I ordered a Fleet enema the night before and the morning of surgery         ALLERGIES: NKDA    MEDICATIONS: None   GU PSH: No GU PSH     NON-GU PSH: No Non-GU PSH        GU PMH: No GU PMH    NON-GU PMH: No Non-GU PMH    FAMILY HISTORY: No Family History    SOCIAL HISTORY: No Social History    REVIEW OF SYSTEMS:    GU Review Female:  Patient denies frequent urination, hard to postpone urination, burning /pain with urination, get up at night to urinate, leakage of urine, stream starts and stops, trouble starting your stream, have to strain to urinate, and being pregnant.   Gastrointestinal (Upper):  Patient denies nausea, vomiting, and indigestion/ heartburn.   Gastrointestinal (Lower):  Patient denies diarrhea and constipation.   Constitutional:  Patient denies fever, night sweats, weight loss, and fatigue.   Skin:  Patient denies skin rash/ lesion and itching.   Eyes:  Patient denies blurred vision and double vision.   Ears/ Nose/ Throat:  Patient denies sore throat and sinus problems.   Hematologic/Lymphatic:  Patient denies swollen glands and easy bruising.   Cardiovascular:  Patient denies leg swelling and chest pains.   Respiratory:  Patient denies cough and shortness of breath.   Endocrine:  Patient denies excessive thirst.   Musculoskeletal:  Patient denies back pain and joint pain.   Neurological:  Patient denies headaches and dizziness.   Psychologic:  Patient denies depression and anxiety.   VITAL SIGNS:      02/23/2020 01:09 PM    Weight 138.7 lb / 62.91 kg    Height 61 in / 154.94 cm    BP 123/77 mmHg    Pulse 80 /min    Temperature 98.2 F / 36.7 C    BMI 26.2 kg/m    MULTI-SYSTEM PHYSICAL EXAMINATION:     Constitutional: Well-nourished. No physical deformities. Normally developed. Good grooming.    Neck: Neck symmetrical, not swollen. Normal tracheal position.    Respiratory: No labored breathing, no use of accessory muscles.     Cardiovascular: Normal temperature, normal extremity pulses, no swelling, no varicosities.    Lymphatic: No enlargement of neck, axillae, groin.    Skin: No paleness, no  jaundice, no cyanosis. No lesion, no ulcer, no rash.    Neurologic / Psychiatric: Oriented to time, oriented to place, oriented to person. No depression, no anxiety, no agitation.    Gastrointestinal: No mass, no tenderness, no rigidity, non obese abdomen.    Eyes: Normal conjunctivae. Normal eyelids.    Ears, Nose, Mouth, and Throat: Left ear no scars, no lesions, no masses. Right ear no scars, no lesions, no masses. Nose no scars, no lesions, no masses. Normal hearing. Normal lips.    Musculoskeletal: Normal gait and station of head and neck.          PAST DATA REVIEW: None   PROCEDURES:    Flexible Cystoscopy - 52000  Risks, benefits, and some of the potential complications of the procedure were discussed at length with the patient including infection, bleeding, voiding discomfort, urinary retention, fever, chills, sepsis, and others. All questions were answered. Informed consent was obtained. Sterile technique and intraurethral analgesia were used.  Ureteral Orifices:  Normal location. Normal size. Normal shape. Effluxed clear urine.  Bladder:  Patient underwent flexible cystoscopy. The bladder itself looked normal. I saw no foreign body or cystitis. In the urethra I could see mesh or least Prolene suture on the mesh at 3 oclock but really along the left border of the urethra with approximately a 1 cm stone on it. It was just at the interface of the bladder and the urethra. The rest the urethra looked normal..      The lower urinary tract was carefully examined. There was no stitch, foreign, body, or carcinoma. The procedure was well-tolerated and without complications. Antibiotic instructions were given. Instructions were given to call the office immediately for bloody urine, difficulty urinating, urinary retention, painful or frequent urination, fever, chills, nausea, vomiting or other illness. The patient stated that she understood these instructions and would comply with them.    PVR  Ultrasound - 19379  Scanned Volume: 0 cc    Urodynamics - 51600, 51729, 51741, [id number], [id number]  URODYNAMICS STUDY  Test Indication: Frequency, Urgency, Nocturia, Incontinence The procedure's risks, benefits and infection risk were discussed with the patient.  PRE UROFLOW & CATHETERIZATION Procedure: Pre Uroflow Study She voided 56 mls with a max flow of 7 ml/s. A 14 FR straight catheter was inserted and scant 30 mls was obtained. A Urodynamic catheter was inserted.   CYSTOMETRY/CYSTOMETROGRAM The bladder was filled with room temperature water at a rate of less than 50 cc per minute. Injection of contrast was performed for the cystometrogram. Max capacity was approx. 325 mls. First sensation occurred at 29 mls. Normal desire occurred at 38 cmH20. Strong desire occurred at approx. 90 mls.  The bladder was unstable. First unstable contraction occurred at 164 mls. Max unstable detrusor contraction was 6 cmH20. She felt urgency but was able to the inhibit her instability without leaking.  LEAK POINT PRESSURE LPPs were assessed with pt in a seated position. Both x-ray and visualization were used to assess for SUI.  No SUI was noted with abdominal pressures of 74-86 cmH20.  PRESSURE FLOW STUDY She was able to generate a voluntary contraction and void. She voided with a prolonged interrupted flow pattern. She said it was more volume than she normally voids.  Volume voided approx. 296 mls. Max flow was 14 ml/s. Detrusor pressure at peak flow was 18 cmH20. Max detrusor pressure was 23 cmH20. PVR was approx. 30 mls.  ELECTROMYOGRAM Activity was measured by surface electrodes Intermittent increase in EMG leads was noted during voiding.  FLUOROSCOPY and VCUG During cough and Valsalva, the bladder descended approximately 1-2 cm. Mild trabeculation was noted. No reflux was seen. Denied any external medical devices.  POST PROCEDURE ANTIBIOTICS: Cipro 500 mg po given post UDS.  NURSE  IMPRESSION Catherine Cervantes held a max capacity of approx. 325 mls. Her 1st sensation was felt at 29 mls. No SUI was noted. There was positive low amplitude instability. She felt a lot of urgency but was able to inhibit leaking. She was able to generate a voluntary contraction and void. She voided with a prolonged interrupted flow pattern. Intermittent increase in EMG activity was noted during voiding. PVR approx. 30 mls. Mild trabeculation was noted, but no  reflux was seen.     Urinalysis w/Scope  Dipstick Dipstick Cont'd Micro  Color: Yellow Bilirubin: Neg mg/dL WBC/hpf: 40 - 16/[facility]  Appearance: Cloudy Ketones: Neg mg/dL RBC/hpf: 3 - 93/ATF  Specific Gravity: 1.025 Blood: 2+ ery/uL Bacteria: Mod (26-50/hpf)  pH: 6.5 Protein: Neg mg/dL Cystals: NS (Not Seen)  Glucose: Neg mg/dL Urobilinogen: 0.2 mg/dL Casts: NS (Not Seen)   Nitrites: Neg Trichomonas: Not Present   Leukocyte Esterase: 3+ leu/uL Mucous: Present    Epithelial Cells: 0 - 5/hpf    Yeast: NS (Not Seen)    Sperm: Not Present  Urinalysis w/Scope  Dipstick Dipstick Cont'd Micro  Color: Straw Bilirubin: Neg mg/dL WBC/hpf: 20 - 57/[facility]  Appearance: Clear Ketones: Neg mg/dL RBC/hpf: 3 - 02/[facility]  Specific Gravity: 1.015 Blood: 2+ ery/uL Bacteria: Rare (0-9/hpf)  pH: 5.5 Protein: Neg mg/dL Cystals: NS (Not Seen)  Glucose: Neg mg/dL Urobilinogen: 0.2 mg/dL Casts: NS (Not Seen)   Nitrites: Neg Trichomonas: Not Present   Leukocyte Esterase: 3+ leu/uL Mucous: Not Present    Epithelial Cells: 0 - 5/hpf    Yeast: NS (Not Seen)    Sperm: Not Present   Notes: MICROSCOPIC NOT CONCENTRATED     ASSESSMENT:     ICD-10 Details  1 GU:  Nocturia - R35.1   2  Mixed incontinence - N39.46   3  Urinary Frequency - R35.0   4  Urinary Urgency - R39.15    Notes:  I drew her a picture and we talked about reconstructive surgery in detail. Pros, cons, general surgical and anesthetic risks, and other options including watchful waiting were discussed. She  understands that surgery is successful in approximately 85% of cases. Failure rates and the risk of persistence/recurrence/and worsening of the problem were discussed. Surgical risks were described but not limited to the discussion of injury to neighboring structures including the bowel (with possible life-threatening sepsis and colostomy), bladder, urethra, vagina (all resulting in further surgery), and ureter (resulting in re-implantation). We talked about injury to nerves/soft tissue leading to debilitating and intractable pelvic, abdominal, and lower extremity pain syndromes and neuropathies. The risks of dyspareunia, vaginal narrowing and shortening with sequelae were discussed. The risk of persistent, de novo, or worsening incontinence/dysfunction was discussed. Bleeding risks, transfusion rates, and infection were discussed. The usual post-operative course was described. The patient understands that she might not reach her treatment goal and that she might be worse following surgery.   After a thorough review of the management options for the patient's condition the patient  elected to proceed with surgical therapy as noted above. We have discussed the potential benefits and risks of the procedure, side effects of the proposed treatment, the likelihood of the patient achieving the goals of the procedure, and any potential problems that might occur during the procedure or recuperation. Informed consent has been obtained.

## 2020-04-08 ENCOUNTER — Other Ambulatory Visit: Payer: Self-pay

## 2020-04-08 ENCOUNTER — Other Ambulatory Visit
Admission: RE | Admit: 2020-04-08 | Discharge: 2020-04-08 | Disposition: A | Discharge status: Home / Self Care | Payer: BLUE CROSS/BLUE SHIELD | Source: Ambulatory Visit | Attending: Urology | Admitting: Urology

## 2020-04-08 DIAGNOSIS — Z20822 Contact with and (suspected) exposure to covid-19: Secondary | ICD-10-CM | POA: Diagnosis not present

## 2020-04-08 DIAGNOSIS — Z01812 Encounter for preprocedural laboratory examination: Secondary | ICD-10-CM | POA: Insufficient documentation

## 2020-04-09 LAB — SARS CORONAVIRUS 2 (TAT 6-24 HRS): SARS Coronavirus 2: NEGATIVE

## 2020-04-11 ENCOUNTER — Encounter (HOSPITAL_COMMUNITY): Payer: Self-pay | Admitting: Urology

## 2020-04-11 MED ORDER — CLINDAMYCIN PHOSPHATE 900 MG/50ML IV SOLN
900.0000 mg | INTRAVENOUS | Status: AC
  Administered 2020-04-12: 900 mg via INTRAVENOUS
  Filled 2020-04-11: qty 50

## 2020-04-11 MED ORDER — GENTAMICIN SULFATE 40 MG/ML IJ SOLN
5.0000 mg/kg | INTRAVENOUS | Status: AC
  Administered 2020-04-12: 280 mg via INTRAVENOUS
  Filled 2020-04-11: qty 7

## 2020-04-12 ENCOUNTER — Ambulatory Visit (HOSPITAL_COMMUNITY): Payer: BLUE CROSS/BLUE SHIELD | Admitting: Anesthesiology

## 2020-04-12 ENCOUNTER — Encounter (HOSPITAL_COMMUNITY): Payer: Self-pay | Admitting: Urology

## 2020-04-12 ENCOUNTER — Encounter (HOSPITAL_COMMUNITY)
Admission: RE | Disposition: A | Discharge status: Home / Self Care | Payer: Self-pay | Source: Ambulatory Visit | Attending: Urology

## 2020-04-12 ENCOUNTER — Ambulatory Visit (HOSPITAL_COMMUNITY)
Admission: RE | Admit: 2020-04-12 | Discharge: 2020-04-12 | Disposition: A | Discharge status: Home / Self Care | Payer: BLUE CROSS/BLUE SHIELD | Source: Ambulatory Visit | Attending: Urology | Admitting: Urology

## 2020-04-12 DIAGNOSIS — T190XXA Foreign body in urethra, initial encounter: Secondary | ICD-10-CM | POA: Insufficient documentation

## 2020-04-12 DIAGNOSIS — N211 Calculus in urethra: Secondary | ICD-10-CM | POA: Insufficient documentation

## 2020-04-12 DIAGNOSIS — X58XXXA Exposure to other specified factors, initial encounter: Secondary | ICD-10-CM | POA: Diagnosis not present

## 2020-04-12 DIAGNOSIS — R350 Frequency of micturition: Secondary | ICD-10-CM | POA: Diagnosis not present

## 2020-04-12 DIAGNOSIS — I10 Essential (primary) hypertension: Secondary | ICD-10-CM | POA: Insufficient documentation

## 2020-04-12 DIAGNOSIS — N3946 Mixed incontinence: Secondary | ICD-10-CM | POA: Diagnosis not present

## 2020-04-12 DIAGNOSIS — Y9389 Activity, other specified: Secondary | ICD-10-CM | POA: Insufficient documentation

## 2020-04-12 HISTORY — PX: ANTERIOR AND POSTERIOR REPAIR: SHX5121

## 2020-04-12 HISTORY — PX: CYSTOSCOPY: SHX5120

## 2020-04-12 LAB — TYPE AND SCREEN
ABO/RH(D): B POS
Antibody Screen: NEGATIVE

## 2020-04-12 LAB — ABO/RH: ABO/RH(D): B POS

## 2020-04-12 SURGERY — ANTERIOR (CYSTOCELE) AND POSTERIOR REPAIR (RECTOCELE)
Anesthesia: General

## 2020-04-12 MED ORDER — CLINDAMYCIN PHOSPHATE 2 % VA CREA
TOPICAL_CREAM | VAGINAL | Status: DC | PRN
  Administered 2020-04-12: 1 via VAGINAL

## 2020-04-12 MED ORDER — CLINDAMYCIN PHOSPHATE 2 % VA CREA
TOPICAL_CREAM | VAGINAL | Status: AC
  Filled 2020-04-12: qty 80

## 2020-04-12 MED ORDER — ROCURONIUM BROMIDE 10 MG/ML (PF) SYRINGE
PREFILLED_SYRINGE | INTRAVENOUS | Status: DC | PRN
  Administered 2020-04-12: 50 mg via INTRAVENOUS
  Administered 2020-04-12: 5 mg via INTRAVENOUS

## 2020-04-12 MED ORDER — LACTATED RINGERS IV SOLN: INTRAVENOUS | Status: DC

## 2020-04-12 MED ORDER — INDIGOTINDISULFONATE SODIUM 8 MG/ML IJ SOLN
INTRAMUSCULAR | Status: AC
  Filled 2020-04-12: qty 5

## 2020-04-12 MED ORDER — ONDANSETRON HCL 4 MG/2ML IJ SOLN
INTRAMUSCULAR | Status: DC | PRN
  Administered 2020-04-12: 4 mg via INTRAVENOUS

## 2020-04-12 MED ORDER — MIDAZOLAM HCL 5 MG/5ML IJ SOLN
INTRAMUSCULAR | Status: DC | PRN
  Administered 2020-04-12: 2 mg via INTRAVENOUS

## 2020-04-12 MED ORDER — EPHEDRINE SULFATE-NACL 50-0.9 MG/10ML-% IV SOSY
PREFILLED_SYRINGE | INTRAVENOUS | Status: DC | PRN
  Administered 2020-04-12: 5 mg via INTRAVENOUS

## 2020-04-12 MED ORDER — CHLORHEXIDINE GLUCONATE 0.12 % MT SOLN
15.0000 mL | Freq: Once | OROMUCOSAL | Status: AC
  Administered 2020-04-12: 15 mL via OROMUCOSAL

## 2020-04-12 MED ORDER — ROCURONIUM BROMIDE 10 MG/ML (PF) SYRINGE
PREFILLED_SYRINGE | INTRAVENOUS | Status: AC
  Filled 2020-04-12: qty 10

## 2020-04-12 MED ORDER — ONDANSETRON HCL 4 MG/2ML IJ SOLN: 4.0000 mg | Freq: Once | INTRAMUSCULAR | Status: DC | PRN

## 2020-04-12 MED ORDER — SUGAMMADEX SODIUM 200 MG/2ML IV SOLN
INTRAVENOUS | Status: DC | PRN
  Administered 2020-04-12: 200 mg via INTRAVENOUS

## 2020-04-12 MED ORDER — PROPOFOL 10 MG/ML IV BOLUS
INTRAVENOUS | Status: DC | PRN
  Administered 2020-04-12: 160 mg via INTRAVENOUS

## 2020-04-12 MED ORDER — LIDOCAINE HCL URETHRAL/MUCOSAL 2 % EX GEL
CUTANEOUS | Status: AC
  Filled 2020-04-12: qty 30

## 2020-04-12 MED ORDER — FENTANYL CITRATE (PF) 100 MCG/2ML IJ SOLN: 25.0000 ug | INTRAMUSCULAR | Status: DC | PRN

## 2020-04-12 MED ORDER — STERILE WATER FOR IRRIGATION IR SOLN
Status: DC | PRN
  Administered 2020-04-12: 3000 mL

## 2020-04-12 MED ORDER — ALBUMIN HUMAN 5 % IV SOLN: INTRAVENOUS | Status: DC | PRN

## 2020-04-12 MED ORDER — LIDOCAINE-EPINEPHRINE (PF) 1 %-1:200000 IJ SOLN
INTRAMUSCULAR | Status: AC
  Filled 2020-04-12: qty 30

## 2020-04-12 MED ORDER — MIDAZOLAM HCL 2 MG/2ML IJ SOLN
INTRAMUSCULAR | Status: AC
  Filled 2020-04-12: qty 2

## 2020-04-12 MED ORDER — FENTANYL CITRATE (PF) 250 MCG/5ML IJ SOLN
INTRAMUSCULAR | Status: AC
  Filled 2020-04-12: qty 5

## 2020-04-12 MED ORDER — SODIUM CHLORIDE 0.9 % IV SOLN
INTRAVENOUS | Status: DC | PRN
  Administered 2020-04-12: 500 mL

## 2020-04-12 MED ORDER — LIDOCAINE 2% (20 MG/ML) 5 ML SYRINGE
INTRAMUSCULAR | Status: DC | PRN
  Administered 2020-04-12: 40 mg via INTRAVENOUS
  Administered 2020-04-12: 60 mg via INTRAVENOUS

## 2020-04-12 MED ORDER — DEXAMETHASONE SODIUM PHOSPHATE 10 MG/ML IJ SOLN
INTRAMUSCULAR | Status: DC | PRN
  Administered 2020-04-12: 10 mg via INTRAVENOUS

## 2020-04-12 MED ORDER — FENTANYL CITRATE (PF) 100 MCG/2ML IJ SOLN
INTRAMUSCULAR | Status: DC | PRN
Start: 2020-04-12 — End: 2020-04-12
  Administered 2020-04-12 (×2): 50 ug via INTRAVENOUS
  Administered 2020-04-12: 100 ug via INTRAVENOUS

## 2020-04-12 MED ORDER — ONDANSETRON HCL 4 MG/2ML IJ SOLN
INTRAMUSCULAR | Status: AC
  Filled 2020-04-12: qty 4

## 2020-04-12 MED ORDER — ORAL CARE MOUTH RINSE: 15.0000 mL | Freq: Once | OROMUCOSAL | Status: AC

## 2020-04-12 MED ORDER — OXYCODONE HCL 5 MG/5ML PO SOLN: 5.0000 mg | Freq: Once | ORAL | Status: DC | PRN

## 2020-04-12 MED ORDER — ALBUMIN HUMAN 5 % IV SOLN
INTRAVENOUS | Status: AC
  Filled 2020-04-12: qty 250

## 2020-04-12 MED ORDER — DEXAMETHASONE SODIUM PHOSPHATE 10 MG/ML IJ SOLN
INTRAMUSCULAR | Status: AC
  Filled 2020-04-12: qty 2

## 2020-04-12 MED ORDER — PROPOFOL 10 MG/ML IV BOLUS
INTRAVENOUS | Status: AC
  Filled 2020-04-12: qty 40

## 2020-04-12 MED ORDER — OXYCODONE HCL 5 MG PO TABS: 5.0000 mg | ORAL_TABLET | Freq: Once | ORAL | Status: DC | PRN

## 2020-04-12 MED ORDER — SODIUM CHLORIDE 0.9 % IV SOLN
INTRAVENOUS | Status: AC
  Filled 2020-04-12: qty 500000

## 2020-04-12 MED ORDER — LIDOCAINE 2% (20 MG/ML) 5 ML SYRINGE
INTRAMUSCULAR | Status: AC
  Filled 2020-04-12: qty 10

## 2020-04-12 SURGICAL SUPPLY — 58 items
BAG DECANTER FOR FLEXI CONT (MISCELLANEOUS) ×2 IMPLANT
BAG DRN RND TRDRP ANRFLXCHMBR (UROLOGICAL SUPPLIES) ×1
BAG URINE DRAIN 2000ML AR STRL (UROLOGICAL SUPPLIES) ×2 IMPLANT
BAG URO CATCHER STRL LF (MISCELLANEOUS) IMPLANT
BLADE SURG 15 STRL LF DISP TIS (BLADE) ×1 IMPLANT
BLADE SURG 15 STRL SS (BLADE) ×2
BRIEF STRETCH FOR OB PAD LRG (UNDERPADS AND DIAPERS) ×2 IMPLANT
CATH FOLEY 2W COUNCIL 5CC 18FR (CATHETERS) ×2 IMPLANT
CATH FOLEY 2WAY SLVR  5CC 14FR (CATHETERS) ×1
CATH FOLEY 2WAY SLVR 5CC 14FR (CATHETERS) ×1 IMPLANT
CLOTH BEACON ORANGE TIMEOUT ST (SAFETY) ×2 IMPLANT
COVER MAYO STAND STRL (DRAPES) ×2 IMPLANT
COVER SURGICAL LIGHT HANDLE (MISCELLANEOUS) ×2 IMPLANT
COVER WAND RF STERILE (DRAPES) ×2 IMPLANT
DECANTER SPIKE VIAL GLASS SM (MISCELLANEOUS) ×2 IMPLANT
DEVICE CAPIO SLIM SINGLE (INSTRUMENTS) IMPLANT
DRAIN PENROSE 0.25X18 (DRAIN) ×2 IMPLANT
DRAPE UNDERBUTTOCKS STRL (DISPOSABLE) ×2 IMPLANT
ELECT PENCIL ROCKER SW 15FT (MISCELLANEOUS) ×2 IMPLANT
GAUZE 4X4 16PLY RFD (DISPOSABLE) ×4 IMPLANT
GAUZE PACKING 1 X5 YD ST (GAUZE/BANDAGES/DRESSINGS) ×4 IMPLANT
GLOVE BIO SURGEON STRL SZ 6.5 (GLOVE) ×2 IMPLANT
GLOVE BIOGEL M STRL SZ7.5 (GLOVE) ×2 IMPLANT
GLOVE ECLIPSE 8.5 STRL (GLOVE) ×2 IMPLANT
GOWN STRL REUS W/TWL XL LVL3 (GOWN DISPOSABLE) ×2 IMPLANT
HOLDER FOLEY CATH W/STRAP (MISCELLANEOUS) ×2 IMPLANT
IV NS 1000ML (IV SOLUTION)
IV NS 1000ML BAXH (IV SOLUTION) IMPLANT
KIT BASIN OR (CUSTOM PROCEDURE TRAY) ×2 IMPLANT
KIT TURNOVER KIT A (KITS) IMPLANT
NEEDLE HYPO 22GX1.5 SAFETY (NEEDLE) ×2 IMPLANT
NEEDLE MAYO 6 CRC TAPER PT (NEEDLE) ×2 IMPLANT
PACK CYSTO (CUSTOM PROCEDURE TRAY) ×2 IMPLANT
PACKING VAGINAL (PACKING) ×2 IMPLANT
PAD OB MATERNITY 4.3X12.25 (PERSONAL CARE ITEMS) ×2 IMPLANT
PLUG CATH AND CAP STER (CATHETERS) ×2 IMPLANT
PROTECTOR NERVE ULNAR (MISCELLANEOUS) ×2 IMPLANT
RETRACTOR STAY HOOK 5MM (MISCELLANEOUS) ×2 IMPLANT
SHEET LAVH (DRAPES) ×2 IMPLANT
SUT CAPIO ETHIBPND (SUTURE) IMPLANT
SUT SILK 2 0 30  PSL (SUTURE)
SUT SILK 2 0 30 PSL (SUTURE) IMPLANT
SUT VIC AB 0 CT1 27 (SUTURE) ×2
SUT VIC AB 0 CT1 27XBRD ANTBC (SUTURE) ×1 IMPLANT
SUT VIC AB 2-0 CT1 27 (SUTURE) ×4
SUT VIC AB 2-0 CT1 27XBRD (SUTURE) ×2 IMPLANT
SUT VIC AB 2-0 SH 27 (SUTURE) ×4
SUT VIC AB 2-0 SH 27X BRD (SUTURE) ×2 IMPLANT
SUT VIC AB 3-0 SH 27 (SUTURE) ×4
SUT VIC AB 3-0 SH 27XBRD (SUTURE) ×2 IMPLANT
SUT VICRYL 0 UR6 27IN ABS (SUTURE) IMPLANT
SYR 10ML LL (SYRINGE) ×2 IMPLANT
SYR 50ML LL SCALE MARK (SYRINGE) ×2 IMPLANT
TOWEL OR 17X26 10 PK STRL BLUE (TOWEL DISPOSABLE) ×2 IMPLANT
TOWEL OR NON WOVEN STRL DISP B (DISPOSABLE) ×2 IMPLANT
TUBING CONNECTING 10 (TUBING) ×2 IMPLANT
UNDERPAD 30X36 HEAVY ABSORB (UNDERPADS AND DIAPERS) ×2 IMPLANT
YANKAUER SUCT BULB TIP 10FT TU (MISCELLANEOUS) ×2 IMPLANT

## 2020-04-12 NOTE — Anesthesia Postprocedure Evaluation (Signed)
Anesthesia Post Note  Patient: Catherine Cervantes  Procedure(s) Performed: EXAM UNDER ANESTHESIA (N/A ) CYSTOSCOPY (N/A )     Patient location during evaluation: PACU Anesthesia Type: General Level of consciousness: awake and alert Pain management: pain level controlled Vital Signs Assessment: post-procedure vital signs reviewed and stable Respiratory status: spontaneous breathing, nonlabored ventilation, respiratory function stable and patient connected to nasal cannula oxygen Cardiovascular status: blood pressure returned to baseline and stable Postop Assessment: no apparent nausea or vomiting Anesthetic complications: no   No complications documented.  Last Vitals:  Vitals:   04/12/20 1033 04/12/20 1143  BP: 126/72 123/69  Pulse: 64 69  Resp: 18   Temp: 36.4 C (!) 36.4 C  SpO2: 100% 94%    Last Pain:  Vitals:   04/12/20 1143  TempSrc:   PainSc: 3                  [name]

## 2020-04-12 NOTE — Interval H&P Note (Signed)
History and Physical Interval Note:  04/12/2020 7:15 AM  Catherine Cervantes  has presented today for surgery, with the diagnosis of MESH AND STONE IN URETHRA.  The various methods of treatment have been discussed with the patient and family. After consideration of risks, benefits and other options for treatment, the patient has consented to  Procedure(s): TANSVAGINAL REMOVAL OF MESH AND STONE IN URETHRA (N/A) CYSTOSCOPY (N/A) as a surgical intervention.  The patient's history has been reviewed, patient examined, no change in status, stable for surgery.  I have reviewed the patient's chart and labs.  Questions were answered to the patient's satisfaction.     Roderica Cathell A Durenda Pechacek

## 2020-04-12 NOTE — Anesthesia Preprocedure Evaluation (Signed)

## 2020-04-12 NOTE — Discharge Instructions (Signed)
I have reviewed discharge instructions in detail with the patient. They will follow-up with me or their physician as scheduled. My nurse will also be calling the patients as per protocol. As discussed with Dr. Tesha Archambeau.  °

## 2020-04-12 NOTE — H&P (Signed)
reviewed my note from [facility] on this patient. It appears she has mesh sling in the proximal 3rd of the urethra and or bladder neck with the stone. She gets blood in the urine and also gets bladder infections. Twenty years ago her stress incontinence was moderately severe even with walking. It was much less severe before the 2nd procedure 8 years ago. She currently leaks with a heart sneeze with does not wear a pad but leaks a lot possibly with thrusting during intercourse. I thought I could see a little bit of mesh at the right urethrovesical angle. She is a petite lady with good support making access more difficult. She has trouble by left lower quadrant discomfort which may or may not be related   On urodynamics the patient voided 56 mL with maximum flow of 7 mL/second. Residual was 30 mL. Maximum bladder capacity 325 mL. Bladder is unstable reaching pressure 6 cm of water. She had no stress incontinence generating a Valsalva pressure of 86 cm of water. During voiding she voided 296 mL with a maximum flow of 14 mL/second. Maximum voiding pressure was 23 cm water. She had interrupted pattern. It was a prolonged pattern. Bladder neck descended 1 or 2 cm. I did not see any filling defect at the bladder neck. The details of the urodynamics or sign-in dictated   Patient underwent flexible cystoscopy. The bladder itself looked normal. I saw no foreign body or cystitis. In the urethra I could see mesh or least Prolene suture on the mesh at 3 o'clock (one to 5) but really along the left border of the urethra with approximately a 1 cm stone on it. It was just at the interface of the bladder and the urethra. The rest the urethra looked normal.   Again she is quite petite and on vaginal exam the speculum would actually be hitting a ridge mid posterior vaginal wall which in retrospect may have been constipation which she has and she has not had a rectocele repair or mesh posteriorly. I had the lift speculum over the  area and depress downward which was a little bit uncomfortable for her. She looked like she had a yeast infection. I could not feel or see any mesh this time but again the bladder neck is significantly well-supported. I was trying with a Q-tip as well   Picture was drawn. I will have her go back on daily prophylaxis and switch it since I think she had a breakthrough on the Keflex.  I will call in Diflucan 100 mg daily for 3 days And put her on daily Macrodantin and send the urine for culture   The position of the mesh and stone is a difficult 1. It may not be visible trans vesicle since is in the urethra. The urethra would need to be open quite aggressively to get exposure transurethrally. Vaginal access will make this more challenging and she would need to be consented that it may not be possible. A trans urethral laser procedure is reasonable but it could make the area larger.   She understands that of the laser procedure fails it can complicate the issue much greater. She understands the vaginal access problem and I will going to give her a lower Fleet enema the night before surgery. We talked about injury to other structure with fistula to vagina from bladder or urethra or both. We talked about rare bowel in ureteral injury and sequelae. She understands her incontinence can worsen in approximately 50% and could be  quite severe. She understands that the intercourse leaking is likely from overactivity and that we would be normally treating this with medicine. She understands that may not be able to do the surgery vaginally but I believe I can. I do not think an abdominal approach will see the mesh appropriately. She understands erosion is usually more ventrally but hers is along the side.   I will also be checking for mesh extrusion intraoperatively   I ordered a Fleet enema the night before and the morning of surgery         ALLERGIES: NKDA    MEDICATIONS: None   GU PSH: No GU PSH     NON-GU PSH: No Non-GU PSH        GU PMH: No GU PMH    NON-GU PMH: No Non-GU PMH    FAMILY HISTORY: No Family History    SOCIAL HISTORY: No Social History    REVIEW OF SYSTEMS:    GU Review Female:  Patient denies frequent urination, hard to postpone urination, burning /pain with urination, get up at night to urinate, leakage of urine, stream starts and stops, trouble starting your stream, have to strain to urinate, and being pregnant.   Gastrointestinal (Upper):  Patient denies nausea, vomiting, and indigestion/ heartburn.   Gastrointestinal (Lower):  Patient denies diarrhea and constipation.   Constitutional:  Patient denies fever, night sweats, weight loss, and fatigue.   Skin:  Patient denies skin rash/ lesion and itching.   Eyes:  Patient denies blurred vision and double vision.   Ears/ Nose/ Throat:  Patient denies sore throat and sinus problems.   Hematologic/Lymphatic:  Patient denies swollen glands and easy bruising.   Cardiovascular:  Patient denies leg swelling and chest pains.   Respiratory:  Patient denies cough and shortness of breath.   Endocrine:  Patient denies excessive thirst.   Musculoskeletal:  Patient denies back pain and joint pain.   Neurological:  Patient denies headaches and dizziness.   Psychologic:  Patient denies depression and anxiety.   VITAL SIGNS:      02/23/2020 01:09 PM    Weight 138.7 lb / 62.91 kg    Height 61 in / 154.94 cm    BP 123/77 mmHg    Pulse 80 /min    Temperature 98.2 F / 36.7 C    BMI 26.2 kg/m    MULTI-SYSTEM PHYSICAL EXAMINATION:     Constitutional: Well-nourished. No physical deformities. Normally developed. Good grooming.    Neck: Neck symmetrical, not swollen. Normal tracheal position.    Respiratory: No labored breathing, no use of accessory muscles.     Cardiovascular: Normal temperature, normal extremity pulses, no swelling, no varicosities.    Lymphatic: No enlargement of neck, axillae, groin.    Skin: No paleness,  no jaundice, no cyanosis. No lesion, no ulcer, no rash.    Neurologic / Psychiatric: Oriented to time, oriented to place, oriented to person. No depression, no anxiety, no agitation.    Gastrointestinal: No mass, no tenderness, no rigidity, non obese abdomen.    Eyes: Normal conjunctivae. Normal eyelids.    Ears, Nose, Mouth, and Throat: Left ear no scars, no lesions, no masses. Right ear no scars, no lesions, no masses. Nose no scars, no lesions, no masses. Normal hearing. Normal lips.    Musculoskeletal: Normal gait and station of head and neck.          PAST DATA REVIEW: None   PROCEDURES:       Flexible Cystoscopy -  52000  Risks, benefits, and some of the potential complications of the procedure were discussed at length with the patient including infection, bleeding, voiding discomfort, urinary retention, fever, chills, sepsis, and others. All questions were answered. Informed consent was obtained. Sterile technique and intraurethral analgesia were used.  Ureteral Orifices:  Normal location. Normal size. Normal shape. Effluxed clear urine.  Bladder:  Patient underwent flexible cystoscopy. The bladder itself looked normal. I saw no foreign body or cystitis. In the urethra I could see mesh or least Prolene suture on the mesh at 3 oclock but really along the left border of the urethra with approximately a 1 cm stone on it. It was just at the interface of the bladder and the urethra. The rest the urethra looked normal..      The lower urinary tract was carefully examined. There was no stitch, foreign, body, or carcinoma. The procedure was well-tolerated and without complications. Antibiotic instructions were given. Instructions were given to call the office immediately for bloody urine, difficulty urinating, urinary retention, painful or frequent urination, fever, chills, nausea, vomiting or other illness. The patient stated that she understood these instructions and would comply with  them.       PVR Ultrasound - 16606  Scanned Volume: 0 cc    Urodynamics - 51600, 51729, 51741, [id number], [id number]  URODYNAMICS STUDY  Test Indication: Frequency, Urgency, Nocturia, Incontinence The procedure's risks, benefits and infection risk were discussed with the patient.  PRE UROFLOW & CATHETERIZATION Procedure: Pre Uroflow Study She voided 56 mls with a max flow of 7 ml/s. A 14 FR straight catheter was inserted and scant 30 mls was obtained. A Urodynamic catheter was inserted.   CYSTOMETRY/CYSTOMETROGRAM The bladder was filled with room temperature water at a rate of less than 50 cc per minute. Injection of contrast was performed for the cystometrogram. Max capacity was approx. 325 mls. First sensation occurred at 29 mls. Normal desire occurred at 38 cmH20. Strong desire occurred at approx. 90 mls.  The bladder was unstable. First unstable contraction occurred at 164 mls. Max unstable detrusor contraction was 6 cmH20. She felt urgency but was able to the inhibit her instability without leaking.  LEAK POINT PRESSURE LPPs were assessed with pt in a seated position. Both x-ray and visualization were used to assess for SUI.  No SUI was noted with abdominal pressures of 74-86 cmH20.  PRESSURE FLOW STUDY She was able to generate a voluntary contraction and void. She voided with a prolonged interrupted flow pattern. She said it was more volume than she normally voids.  Volume voided approx. 296 mls. Max flow was 14 ml/s. Detrusor pressure at peak flow was 18 cmH20. Max detrusor pressure was 23 cmH20. PVR was approx. 30 mls.  ELECTROMYOGRAM Activity was measured by surface electrodes Intermittent increase in EMG leads was noted during voiding.  FLUOROSCOPY and VCUG During cough and Valsalva, the bladder descended approximately 1-2 cm. Mild trabeculation was noted. No reflux was seen. Denied any external medical devices.  POST PROCEDURE ANTIBIOTICS: Cipro 500 mg po given  post UDS.  NURSE IMPRESSION Ms. Bodner held a max capacity of approx. 325 mls. Her 1st sensation was felt at 29 mls. No SUI was noted. There was positive low amplitude instability. She felt a lot of urgency but was able to inhibit leaking. She was able to generate a voluntary contraction and void. She voided with a prolonged interrupted flow pattern. Intermittent increase in EMG activity was noted during voiding. PVR approx. 30 mls. Mild  trabeculation was noted, but no reflux was seen.     Urinalysis w/Scope  Dipstick Dipstick Cont'd Micro  Color: Yellow Bilirubin: Neg mg/dL WBC/hpf: 40 - 10/GYI  Appearance: Cloudy Ketones: Neg mg/dL RBC/hpf: 3 - 94/WNI  Specific Gravity: 1.025 Blood: 2+ ery/uL Bacteria: Mod (26-50/hpf)  pH: 6.5 Protein: Neg mg/dL Cystals: NS (Not Seen)  Glucose: Neg mg/dL Urobilinogen: 0.2 mg/dL Casts: NS (Not Seen)   Nitrites: Neg Trichomonas: Not Present   Leukocyte Esterase: 3+ leu/uL Mucous: Present    Epithelial Cells: 0 - 5/hpf    Yeast: NS (Not Seen)    Sperm: Not Present  Urinalysis w/Scope  Dipstick Dipstick Cont'd Micro  Color: Straw Bilirubin: Neg mg/dL WBC/hpf: 20 - 62/VOJ  Appearance: Clear Ketones: Neg mg/dL RBC/hpf: 3 - 50/KXF  Specific Gravity: 1.015 Blood: 2+ ery/uL Bacteria: Rare (0-9/hpf)  pH: 5.5 Protein: Neg mg/dL Cystals: NS (Not Seen)  Glucose: Neg mg/dL Urobilinogen: 0.2 mg/dL Casts: NS (Not Seen)   Nitrites: Neg Trichomonas: Not Present   Leukocyte Esterase: 3+ leu/uL Mucous: Not Present    Epithelial Cells: 0 - 5/hpf    Yeast: NS (Not Seen)    Sperm: Not Present   Notes: MICROSCOPIC NOT CONCENTRATED     ASSESSMENT:     ICD-10 Details  1 GU:  Nocturia - R35.1   2  Mixed incontinence - N39.46   3  Urinary Frequency - R35.0   4  Urinary Urgency - R39.15    Notes:  I drew her a picture and we talked about reconstructive surgery in detail. Pros, cons, general surgical and anesthetic risks, and other  options including watchful waiting were discussed. She understands that surgery is successful in approximately 85% of cases. Failure rates and the risk of persistence/recurrence/and worsening of the problem were discussed. Surgical risks were described but not limited to the discussion of injury to neighboring structures including the bowel (with possible life-threatening sepsis and colostomy), bladder, urethra, vagina (all resulting in further surgery), and ureter (resulting in re-implantation). We talked about injury to nerves/soft tissue leading to debilitating and intractable pelvic, abdominal, and lower extremity pain syndromes and neuropathies. The risks of dyspareunia, vaginal narrowing and shortening with sequelae were discussed. The risk of persistent, de novo, or worsening incontinence/dysfunction was discussed. Bleeding risks, transfusion rates, and infection were discussed. The usual post-operative course was described. The patient understands that she might not reach her treatment goal and that she might be worse following surgery.   After a thorough review of the management options for the patient's condition the patient  elected to proceed with surgical therapy as noted above. We have discussed the potential benefits and risks of the procedure, side effects of the proposed treatment, the likelihood of the patient achieving the goals of the procedure, and any potential problems that might occur during the procedure or recuperation. Informed consent has been obtained.

## 2020-04-12 NOTE — Anesthesia Procedure Notes (Signed)
Procedure Name: Intubation Date/Time: 04/12/2020 7:54 AM Performed by: Lavina Hamman, CRNA Pre-anesthesia Checklist: Patient identified, Emergency Drugs available, Suction available, Patient being monitored and Timeout performed Patient Re-evaluated:Patient Re-evaluated prior to induction Oxygen Delivery Method: Circle system utilized Preoxygenation: Pre-oxygenation with 100% oxygen Induction Type: IV induction Ventilation: Mask ventilation without difficulty Laryngoscope Size: Mac and 3 Grade View: Grade I Tube type: Oral Tube size: 7.0 mm Number of attempts: 1 Airway Equipment and Method: Stylet Placement Confirmation: ETT inserted through vocal cords under direct vision,  positive ETCO2,  CO2 detector and breath sounds checked- equal and bilateral Secured at: 21 cm Tube secured with: Tape Dental Injury: Teeth and Oropharynx as per pre-operative assessment  Comments: ATOI.

## 2020-04-12 NOTE — Op Note (Signed)
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 Lorin Picket Kyrollos Cordell Assistant Harrie Foreman  The patient has the above diagnosis and consented to the above procedure.  Preoperative antibiotics were given.  Bowel prep was performed.  Leg position was excellent to minimize risk of compartment syndrome and neuropathy and deep vein thrombosis.  The double ring retractor and weighted vaginal speculum worked very well especially relative to the examination in the office.  She had no vaginal ridge posteriorly.  She had a very well supported bladder neck and the urethrovesical angle and proximal urethra was positioned very high.  You could see the interface between the bladder and urethra.  I could not palpate any mesh or see any mesh extrusion  I initially cystoscoped the patient.  The bladder mucosa was normal.  I thought on her left side along the left lateral wall closer to the bladder neck the mucosa looked a little bit thin or pale almost what you would see post resection of a bladder tumor years later.  I looked around the bladder twice and saw no mesh in the bladder.  The mesh with the Prolene suture was in the proximal urethra at the interface of the bladder neck.  The findings were the same in the office.  The mesh was from 1-5 o'clock.  It was in the proximal urethra right at the interface with the bladder.  The bladder neck looked like it was shut at rest.  I decided to do an inverted U-shaped incision to maximize the exposure which was difficult.  It was wide-based.  It was easily made and taken back to the proximal bladder beyond the urethra.  I could not feel the mesh sling with palpation.  I cystoscoped the patient to identify the position of the mesh relative to my incision.  The inverted flap had been taken back far enough.  The tissue underneath the urethra  especially at the level of the proximal urethra was quite thin.  Using the cystoscope for resistance I may have felt what was mesh sling at 5:00 on the left side.  Importantly at this stage I could see strands of mucosa within the bladder in the area on the left side described above.  It was in the same area and in my opinion was from cystoscopy and the weight of the scope likely pulling down on transmural mesh. Some of the mucosa was likely getting frayed but was not exposing any mesh.  This was double checked.  The findings were what I was suspected in the office that she likely has sling at the bladder neck that is transmural and enters the urethra at 1:00 and exits at 5:00.  I was concerned that if I opened up and exposed the mesh I certainly would be in the urethra and in the bladder.  My primary concern was then the transmural mesh may not allow me to close the bladder successfully.  It would place her at a very high risk of a vesicovaginal fistula in the face of a foreign body.  Thinking ahead I do not think a transvesical approach would be ideal unless you could open up the bladder neck with retraction.  It would also be a bit challenging to manage the transmural component in that area.  It would also be challenging I believe to do this robotically.  If she did have a transmural component the bladder neck area the  mesh could be removed and the bladder neck would need to be reconstructed.  I felt that a laser procedure could debulk hopefully the majority of the mesh and certainly the stone.  It would likely not threaten any continence.  If she did have a few strands of mesh left it may or may not form a stone in the urethra with urine transit time.  She can be kept on prophylactic antibiotics  I did not want to laser today even though we had asked for the laser.  I did not want to cause a fistula based upon the thinning of her tissue noted in the face of anterior vaginal wall flap. I also felt that  laser with residual mesh would make another attempt at surgery more difficult.  Foley catheter was replaced and a pack was applied.  She will be sent home from recovery

## 2020-04-12 NOTE — Transfer of Care (Signed)
Immediate Anesthesia Transfer of Care Note  Patient: Catherine Cervantes  Procedure(s) Performed: Procedure(s): EXAM UNDER ANESTHESIA (N/A) CYSTOSCOPY (N/A)  Patient Location: PACU  Anesthesia Type:General  Level of Consciousness:  sedated, patient cooperative and responds to stimulation  Airway & Oxygen Therapy:Patient Spontanous Breathing and Patient connected to face mask oxgen  Post-op Assessment:  Report given to PACU RN and Post -op Vital signs reviewed and stable  Post vital signs:  Reviewed and stable  Last Vitals:  Vitals:   04/12/20 0602 04/12/20 0923  BP: (!) 143/89 124/77  Pulse: (!) 55 78  Resp: 17 15  Temp: 36.5 C 36.5 C  SpO2: 100% 98%    Complications: No apparent anesthesia complications

## 2020-04-13 ENCOUNTER — Encounter (HOSPITAL_COMMUNITY): Payer: Self-pay | Admitting: Urology

## 2020-04-26 ENCOUNTER — Other Ambulatory Visit: Payer: Self-pay | Admitting: Urology

## 2020-12-22 ENCOUNTER — Emergency Department
Admission: EM | Admit: 2020-12-22 | Discharge: 2020-12-23 | Disposition: A | Discharge status: Home / Self Care | Payer: BLUE CROSS/BLUE SHIELD | Attending: Emergency Medicine | Admitting: Emergency Medicine

## 2020-12-22 ENCOUNTER — Emergency Department: Payer: BLUE CROSS/BLUE SHIELD

## 2020-12-22 ENCOUNTER — Other Ambulatory Visit: Payer: Self-pay

## 2020-12-22 DIAGNOSIS — K5900 Constipation, unspecified: Secondary | ICD-10-CM | POA: Insufficient documentation

## 2020-12-22 DIAGNOSIS — Z79899 Other long term (current) drug therapy: Secondary | ICD-10-CM | POA: Insufficient documentation

## 2020-12-22 DIAGNOSIS — K219 Gastro-esophageal reflux disease without esophagitis: Secondary | ICD-10-CM | POA: Insufficient documentation

## 2020-12-22 DIAGNOSIS — T83098A Other mechanical complication of other indwelling urethral catheter, initial encounter: Secondary | ICD-10-CM | POA: Insufficient documentation

## 2020-12-22 DIAGNOSIS — E039 Hypothyroidism, unspecified: Secondary | ICD-10-CM | POA: Diagnosis not present

## 2020-12-22 DIAGNOSIS — I10 Essential (primary) hypertension: Secondary | ICD-10-CM | POA: Diagnosis not present

## 2020-12-22 DIAGNOSIS — J45909 Unspecified asthma, uncomplicated: Secondary | ICD-10-CM | POA: Diagnosis not present

## 2020-12-22 DIAGNOSIS — T83091A Other mechanical complication of indwelling urethral catheter, initial encounter: Secondary | ICD-10-CM

## 2020-12-22 DIAGNOSIS — R1030 Lower abdominal pain, unspecified: Secondary | ICD-10-CM | POA: Diagnosis present

## 2020-12-22 NOTE — Discharge Instructions (Addendum)
Take MiraLAX and Colace for stool softening as directed by your doctor.  Return to the ER for recurrent or worsening symptoms, persistent vomiting, fever or other concerns.

## 2020-12-22 NOTE — ED Notes (Signed)
Patient reports constipation since surgery on 4/26. Patient reports foley post surgery as well. Patient reports anticipated foley removal would be 3 weeks from today. Patient reports some urinary retention in the foley today, but reports that has since resolved. Patient reports she is currently on Bactrim DS abx. Patient reports abdominal discomfort to diffuse abdomen, denies nausea, vomiting or diarrhea. Patient reports trying Colace and Milk of Magnesia prior to arrival.

## 2020-12-22 NOTE — ED Triage Notes (Signed)
Pt reports recent cystoscopy with laser stone extraction and excision bladder sling at [facility] in [place] on 4/26. Pt has foley catheter in place. Pt noted blood clot came out around 1700. Pt reports decreased urinary output that started today. Pt also reports issues with consipation since surgery. Took MOM with minimal relief. Taking bactrim as prescribed.

## 2020-12-22 NOTE — ED Notes (Signed)
Patient transported to X-ray 

## 2020-12-22 NOTE — ED Provider Notes (Signed)
[facility] Emergency Department Provider Note  Time seen: 10:57 PM  I have reviewed the triage vital signs and the nursing notes.   HISTORY  Chief Complaint Constipation Catheter problem  HPI Catherine Cervantes is a 55 y.o. female with a past medical history of anemia, depression, gastric reflux, hypertension,  recent laser kidney stone extraction with bladder sling 4/26, presents to the emergency department for a malfunctioning catheter as well as constipation.  According to the patient since her bladder operation 4/26 she has had a Foley catheter in place.  States today around 4 PM or so she noted that the catheter had stopped putting out urine and she was having some pressure in the lower abdomen.  Patient states she tried cleaning the catheter and coming to the emergency department.  While here she noted that the catheter is putting out urine once again.  Patient states she has seen some debris within the urine but denies any hematuria.  No fever.  Has a secondary complaint patient states since her operation she has not had a bowel movement has been trying Colace at home without success.  Called her doctor yesterday and started MiraLAX as well as Colace with Dulcolax again without success.  No vomiting.  Past Medical History:  Diagnosis Date  . ADD (attention deficit disorder)   . Anemia   . Asthma    childhood  . Bilateral swelling of feet and ankles   . Depression   . GERD (gastroesophageal reflux disease)   . History of kidney stones   . History of uterine fibroid   . Hypertension   . Hypothyroidism   . Insomnia   . Migraine   . OAB (overactive bladder)   . Pneumonia   . PONV (postoperative nausea and vomiting)    History of  . Seasonal allergies   . Sleep apnea    History of OSA prior to gastric bypass no machine use at this time    Patient Active Problem List   Diagnosis Date Noted  . Erosion of implanted urethral mesh to surrounding organ or  tissue, initial encounter (HCC) 01/27/2020  . Difficulty sleeping 11/02/2019  . Chronic intractable headache 09/21/2019  . Attention deficit disorder (ADD) without hyperactivity 02/18/2018  . History of depression 07/24/2017  . Other hyperlipidemia 07/24/2017  . Other specified hypothyroidism 07/24/2017  . Primary insomnia 07/24/2017    Past Surgical History:  Procedure Laterality Date  . ABDOMINAL HYSTERECTOMY    . ABDOMINOPLASTY     x2  . ANTERIOR AND POSTERIOR REPAIR N/A 04/12/2020   Procedure: EXAM UNDER ANESTHESIA;  Surgeon: Alfredo Martinez, MD;  Location: [facility];  Service: Urology;  Laterality: N/A;  . AUGMENTATION MAMMAPLASTY     x2  . CHOLECYSTECTOMY    . CYSTOSCOPY N/A 04/12/2020   Procedure: CYSTOSCOPY;  Surgeon: Alfredo Martinez, MD;  Location: [facility];  Service: Urology;  Laterality: N/A;  . GASTRIC BYPASS    . REFRACTIVE SURGERY    . TUBAL LIGATION    . URETHRAL SLING     x2    Prior to Admission medications   Medication Sig Start Date End Date Taking? Authorizing Provider  ergocalciferol (VITAMIN D2) 1.25 MG (50000 UT) capsule Take by mouth. 07/12/20  Yes [provider]  Biotin 5000 MCG TABS Take 5,000 mcg by mouth daily.    [provider]  Docusate Sodium (DSS) 100 MG CAPS Take by mouth.    [provider]  fexofenadine (ALLEGRA) 180 MG tablet  Take 180 mg by mouth daily.    [provider]  fluticasone (FLONASE) 50 MCG/ACT nasal spray Place 1 spray into both nostrils daily as needed for allergies.  01/15/20   [provider]  hydrochlorothiazide (HYDRODIURIL) 12.5 MG tablet Take 12.5 mg by mouth daily. 12/07/19   [provider]  HYDROcodone-acetaminophen (NORCO/VICODIN) 5-325 MG tablet Take 1 tablet by mouth every 4 (four) hours as needed. 12/20/20   [provider]  levothyroxine (SYNTHROID) 200 MCG tablet Take 200 mcg by mouth daily before breakfast.  11/06/19   [provider]  Magnesium  Oxide 250 MG TABS Take 250 mg by mouth daily.    [provider]  mirtazapine (REMERON) 15 MG tablet Take 15 mg by mouth at bedtime. 12/08/20   [provider]  MYRBETRIQ 50 MG TB24 tablet Take 50 mg by mouth daily. 03/20/20   [provider]  nitrofurantoin, macrocrystal-monohydrate, (MACROBID) 100 MG capsule Take 1 capsule (100 mg total) by mouth every 12 (twelve) hours. Patient taking differently: Take 100 mg by mouth daily. 02/12/20   Alfredo Martinez, MD  omeprazole (PRILOSEC) 20 MG capsule Take 20 mg by mouth 2 (two) times daily. 11/02/19   [provider]  pantoprazole (PROTONIX) 40 MG tablet Take 1 tablet by mouth daily. 10/18/20   [provider]  senna (SENOKOT) 8.6 MG tablet Take by mouth.    [provider]  sulfamethoxazole-trimethoprim (BACTRIM DS) 800-160 MG tablet Take 1 tablet by mouth daily. 12/05/20   [provider]  SUMAtriptan (IMITREX) 100 MG tablet Take 100 mg by mouth 2 (two) times daily as needed for migraine.  10/01/19   [provider]  verapamil (CALAN-SR) 240 MG CR tablet Take 240 mg by mouth at bedtime. 12/17/19   [provider]  zolpidem (AMBIEN) 10 MG tablet Take 10 mg by mouth at bedtime.     [provider]    No Known Allergies  History reviewed. No pertinent family history.  Social History Social History   Tobacco Use  . Smoking status: Never Smoker  . Smokeless tobacco: Never Used  Vaping Use  . Vaping Use: Never used  Substance Use Topics  . Alcohol use: Yes    Comment: occas  . Drug use: Not Currently    Types: Marijuana    Review of Systems Constitutional: Negative for fever. Cardiovascular: Negative for chest pain. Respiratory: Negative for shortness of breath. Gastrointestinal: Mild lower abdominal discomfort due to constipation per patient. Genitourinary: Foley catheter in place. Musculoskeletal: Negative for musculoskeletal complaints Neurological:  Negative for headache All other ROS negative  ____________________________________________   PHYSICAL EXAM:  VITAL SIGNS: ED Triage Vitals  Enc Vitals Group     BP 12/22/20 2024 (!) 154/107     Pulse Rate 12/22/20 2024 88     Resp 12/22/20 2024 18     Temp 12/22/20 2024 98.4 F (36.9 C)     Temp Source 12/22/20 2024 Oral     SpO2 12/22/20 2024 100 %     Weight 12/22/20 2025 150 lb (68 kg)     Height 12/22/20 2025 5\' 2"  (1.575 m)     Head Circumference --      Peak Flow --      Pain Score 12/22/20 2024 6     Pain Loc --      Pain Edu? --      Excl. in GC? --    Constitutional: Alert and oriented. Well appearing and in no  distress. Eyes: Normal exam ENT      Head: Normocephalic and atraumatic.      Mouth/Throat: Mucous membranes are moist. Cardiovascular: Normal rate, regular rhythm.  Respiratory: Normal respiratory effort without tachypnea nor retractions. Breath sounds are clear  Gastrointestinal: Soft, mild lower abdominal tenderness palpation without rebound guarding or distention. Musculoskeletal: Nontender with normal range of motion in all extremities.  Neurologic:  Normal speech and language. No gross focal neurologic deficits Skin:  Skin is warm, dry and intact.  Psychiatric: Mood and affect are normal.  ____________________________________________     RADIOLOGY  X-ray shows large stool burden.  Nonobstructive bowel gas pattern.  ____________________________________________   INITIAL IMPRESSION / ASSESSMENT AND PLAN / ED COURSE  Pertinent labs & imaging results that were available during my care of the patient were reviewed by me and considered in my medical decision making (see chart for details).   Patient presents emergency department for a catheter not putting out urine as well as constipation.  Patient's catheter appears to be putting out urine currently.  We will obtain a bladder scan and flush the catheter to ensure good output.  X-ray shows a  large stool burden and nonobstructive bowel gas pattern.  Offer the patient an enema, she would like to try MiraLAX at home first.  I spoke to the patient was doing Colace twice daily as well as MiraLAX multiple times per day until she has multiple bowel movements.  Patient agreeable to plan of care.  Lakeesha Fontanilla was evaluated in Emergency Department on 12/22/2020 for the symptoms described in the history of present illness. She was evaluated in the context of the global COVID-19 pandemic, which necessitated consideration that the patient might be at risk for infection with the SARS-CoV-2 virus that causes COVID-19. Institutional protocols and algorithms that pertain to the evaluation of patients at risk for COVID-19 are in a state of rapid change based on information released by regulatory bodies including the CDC and federal and state organizations. These policies and algorithms were followed during the patient's care in the ED.  ____________________________________________   FINAL CLINICAL IMPRESSION(S) / ED DIAGNOSES  Constipation Catheter malfunction   Minna Antis, MD 12/22/20 2303

## 2020-12-23 NOTE — ED Provider Notes (Signed)
-----------------------------------------   12:36 AM on 12/23/2020 -----------------------------------------  Bladder scan 0 mL; Foley catheter flushed with great success.  Strict return precautions given.  Patient verbalizes understanding agrees with plan of care.   Irean Hong, MD 12/23/20 [phone]

## 2022-05-10 ENCOUNTER — Other Ambulatory Visit: Payer: Self-pay | Admitting: Internal Medicine

## 2022-05-17 ENCOUNTER — Other Ambulatory Visit: Payer: Self-pay | Admitting: Internal Medicine

## 2022-05-17 DIAGNOSIS — Z1231 Encounter for screening mammogram for malignant neoplasm of breast: Secondary | ICD-10-CM

## 2022-05-24 ENCOUNTER — Ambulatory Visit
Admission: RE | Admit: 2022-05-24 | Discharge: 2022-05-24 | Disposition: A | Discharge status: Home / Self Care | Payer: BLUE CROSS/BLUE SHIELD | Source: Ambulatory Visit | Attending: Internal Medicine | Admitting: Internal Medicine

## 2022-05-24 ENCOUNTER — Other Ambulatory Visit: Payer: Self-pay | Admitting: Internal Medicine

## 2022-05-24 DIAGNOSIS — Z1231 Encounter for screening mammogram for malignant neoplasm of breast: Secondary | ICD-10-CM | POA: Insufficient documentation

## 2022-06-01 ENCOUNTER — Other Ambulatory Visit: Payer: Self-pay | Admitting: Internal Medicine

## 2022-06-01 DIAGNOSIS — N63 Unspecified lump in unspecified breast: Secondary | ICD-10-CM

## 2022-06-20 ENCOUNTER — Ambulatory Visit
Admission: RE | Admit: 2022-06-20 | Discharge: 2022-06-20 | Disposition: A | Discharge status: Home / Self Care | Payer: BLUE CROSS/BLUE SHIELD | Source: Ambulatory Visit | Attending: Internal Medicine | Admitting: Internal Medicine

## 2022-06-20 DIAGNOSIS — N63 Unspecified lump in unspecified breast: Secondary | ICD-10-CM | POA: Insufficient documentation

## 2022-07-25 IMAGING — CR DG ABD PORTABLE 2V
2 series · 2 of 2 positions shown · non-contrast
Comparison: 01/01/2020 CT

CLINICAL DATA: Constipation

EXAM:
PORTABLE ABDOMEN - 2 VIEW

[abdomen erect]
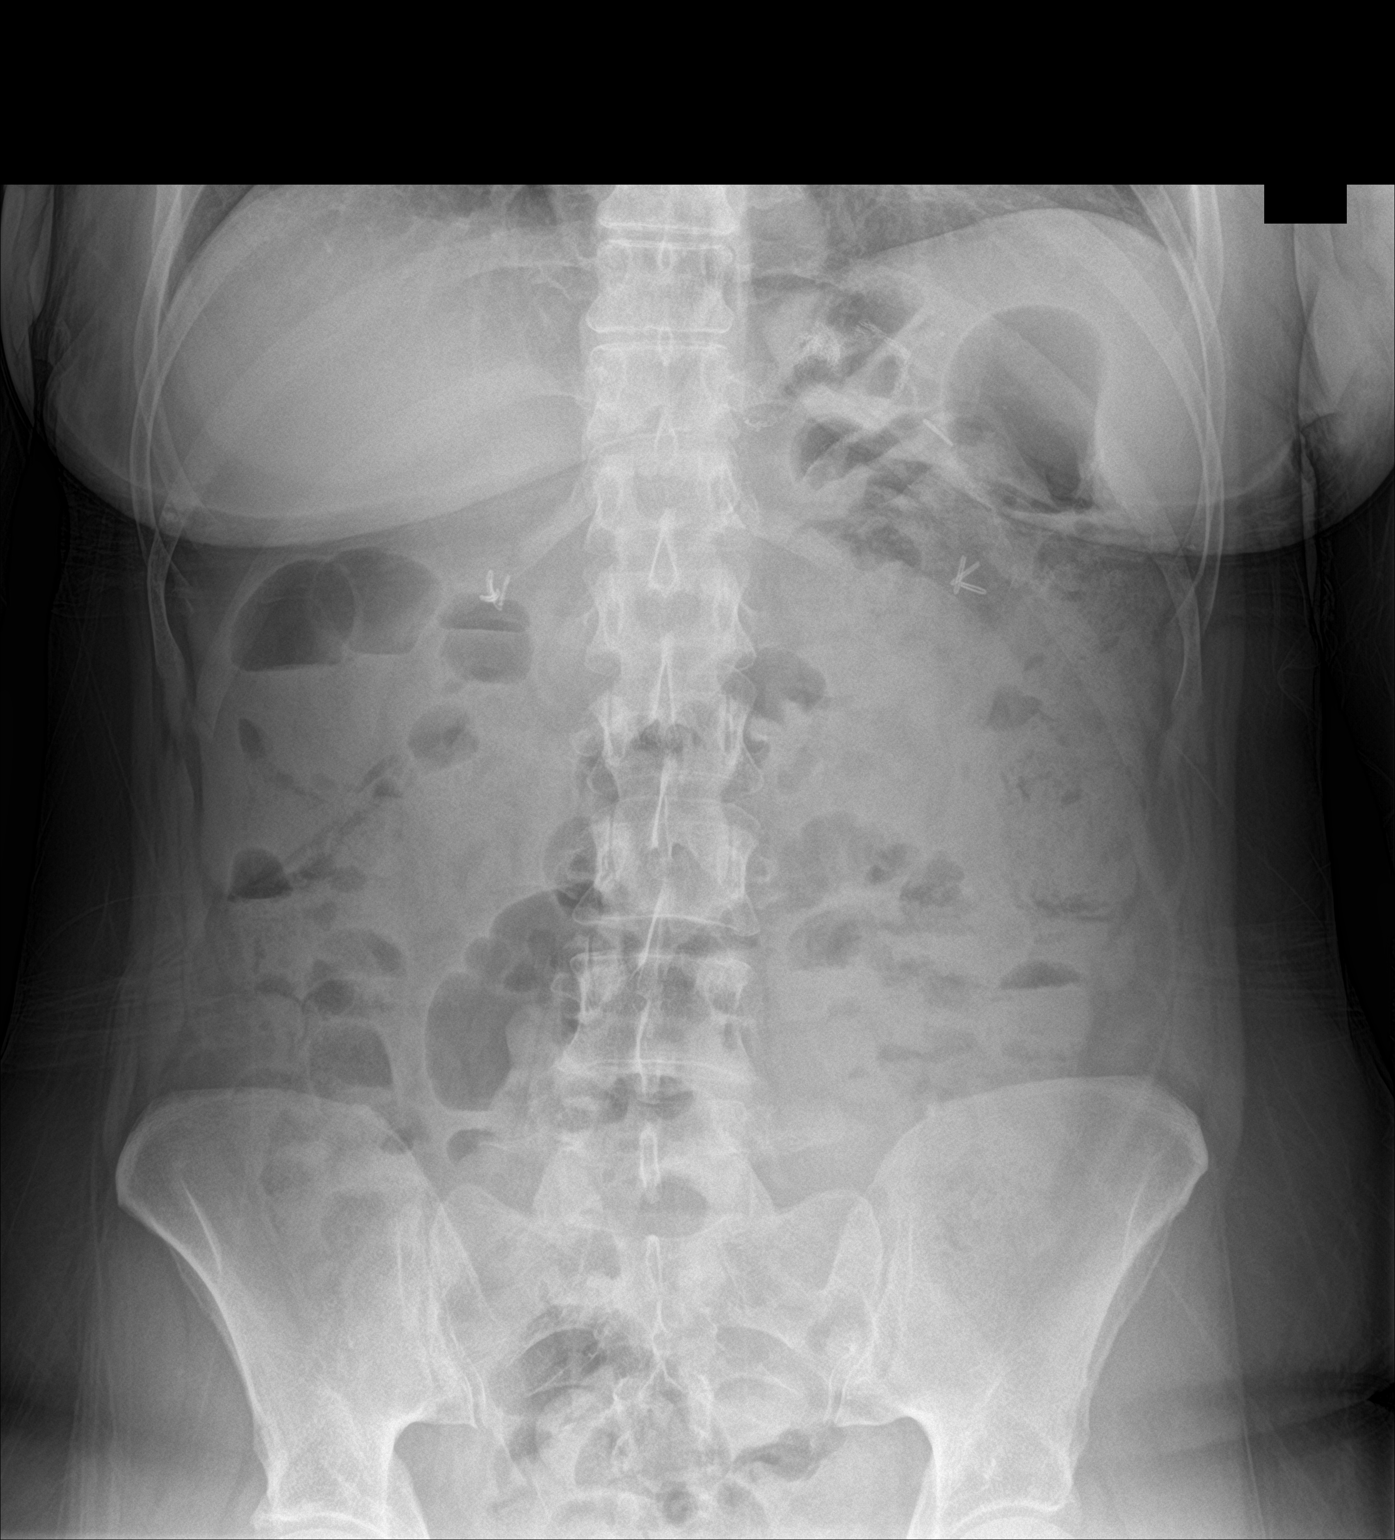

[abdomen kub]
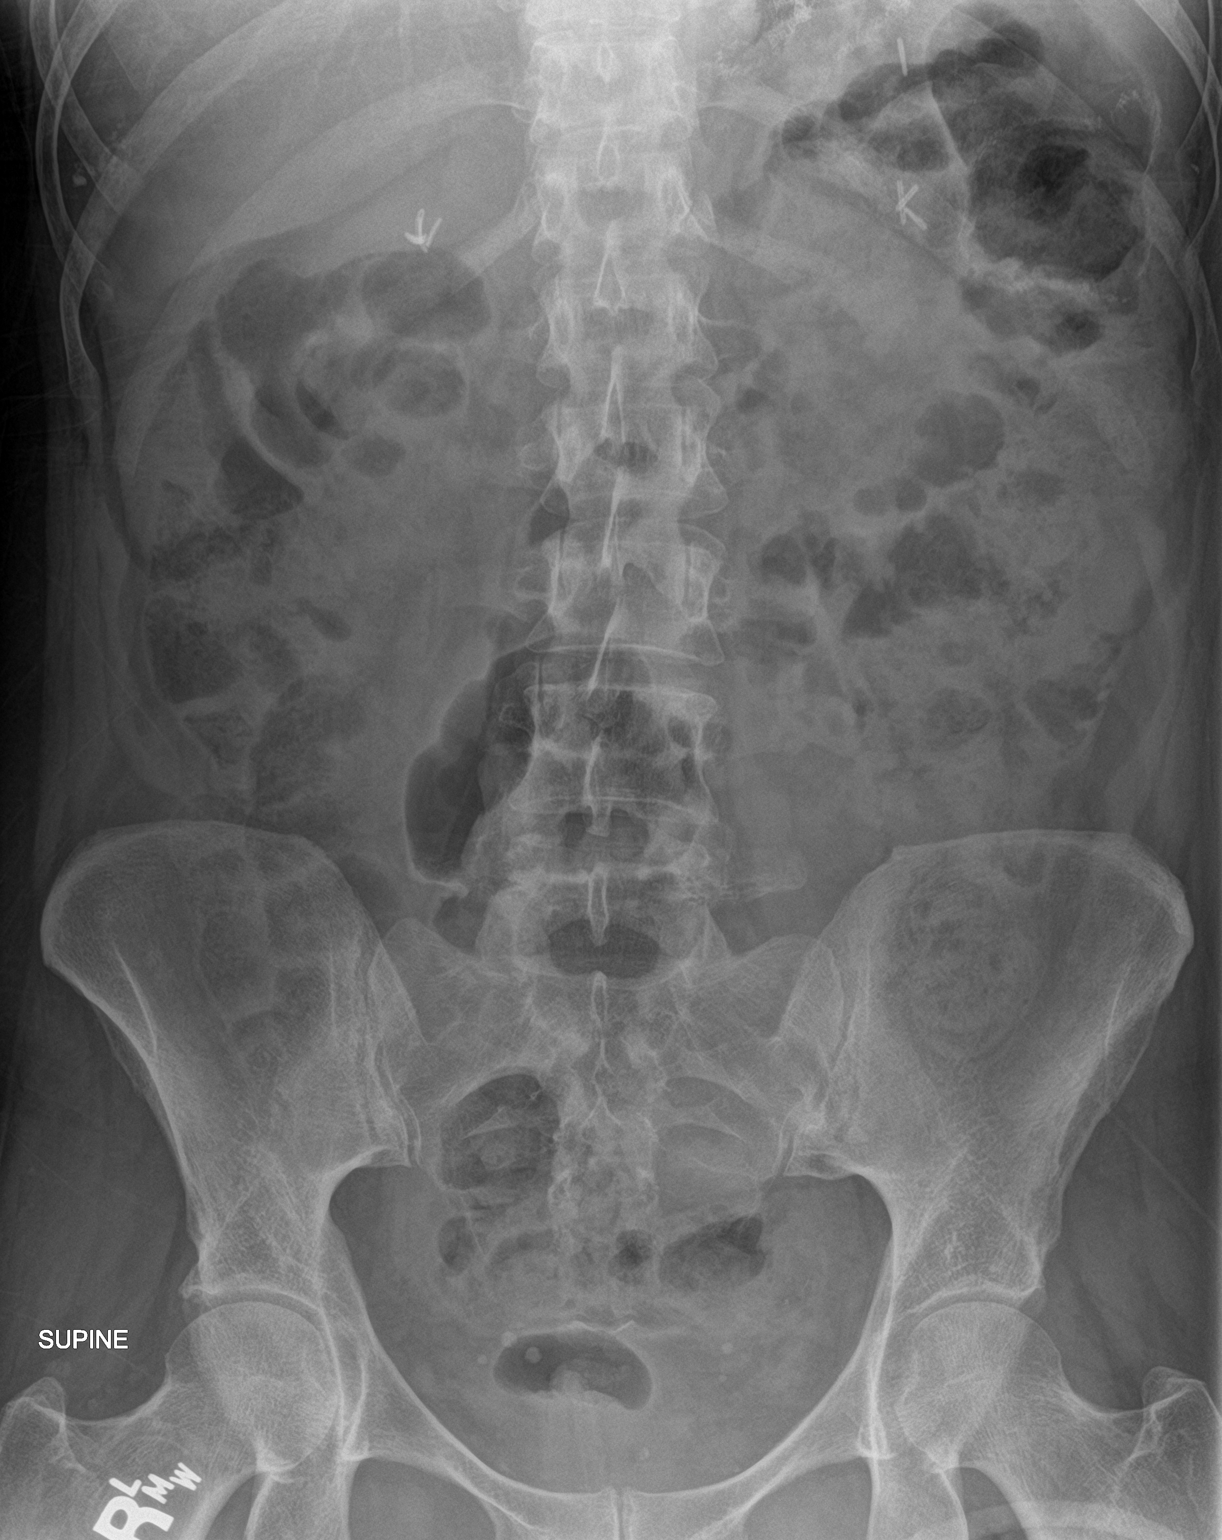

[2 of 2 positions shown; findings below may reference images not displayed]

FINDINGS: Lung bases are clear. Surgical clips in the upper quadrants.
Nonobstructed gas pattern with large amount of stool in the colon.
Phleboliths in the pelvis.
IMPRESSION: Nonobstructed gas pattern with large amount of stool in the colon

## 2023-02-18 LAB — COLOGUARD: COLOGUARD: NEGATIVE

## 2024-02-11 ENCOUNTER — Other Ambulatory Visit: Payer: Self-pay | Admitting: Internal Medicine

## 2024-02-11 DIAGNOSIS — Z1231 Encounter for screening mammogram for malignant neoplasm of breast: Secondary | ICD-10-CM
